# Patient Record
Sex: Male | Born: 1970 | Race: Black or African American | Hispanic: No | Marital: Married | Smoking: Never smoker
Health system: Southern US, Community
[De-identification: ages and names within clinical notes are randomized; demographics above are authoritative.]

## PROBLEM LIST (undated history)

## (undated) DIAGNOSIS — E7800 Pure hypercholesterolemia, unspecified: Secondary | ICD-10-CM

## (undated) DIAGNOSIS — K5900 Constipation, unspecified: Secondary | ICD-10-CM

## (undated) DIAGNOSIS — Z91018 Allergy to other foods: Secondary | ICD-10-CM

## (undated) DIAGNOSIS — G4730 Sleep apnea, unspecified: Secondary | ICD-10-CM

## (undated) DIAGNOSIS — M2550 Pain in unspecified joint: Secondary | ICD-10-CM

## (undated) HISTORY — DX: Sleep apnea, unspecified: G47.30

## (undated) HISTORY — DX: Allergy to other foods: Z91.018

## (undated) HISTORY — DX: Constipation, unspecified: K59.00

## (undated) HISTORY — DX: Pure hypercholesterolemia, unspecified: E78.00

## (undated) HISTORY — PX: OTHER SURGICAL HISTORY: SHX169

## (undated) HISTORY — DX: Pain in unspecified joint: M25.50

---

## 2014-04-25 ENCOUNTER — Emergency Department (HOSPITAL_COMMUNITY): Payer: BC Managed Care – PPO

## 2014-04-25 ENCOUNTER — Encounter (HOSPITAL_COMMUNITY): Payer: Self-pay | Admitting: *Deleted

## 2014-04-25 ENCOUNTER — Emergency Department (HOSPITAL_COMMUNITY)
Admission: EM | Admit: 2014-04-25 | Discharge: 2014-04-25 | Disposition: A | Discharge status: Home / Self Care | Payer: BC Managed Care – PPO | Attending: Emergency Medicine | Admitting: Emergency Medicine

## 2014-04-25 DIAGNOSIS — Y92410 Unspecified street and highway as the place of occurrence of the external cause: Secondary | ICD-10-CM | POA: Diagnosis not present

## 2014-04-25 DIAGNOSIS — R109 Unspecified abdominal pain: Secondary | ICD-10-CM

## 2014-04-25 DIAGNOSIS — Y998 Other external cause status: Secondary | ICD-10-CM | POA: Diagnosis not present

## 2014-04-25 DIAGNOSIS — S3991XA Unspecified injury of abdomen, initial encounter: Secondary | ICD-10-CM | POA: Insufficient documentation

## 2014-04-25 DIAGNOSIS — Y9389 Activity, other specified: Secondary | ICD-10-CM | POA: Diagnosis not present

## 2014-04-25 DIAGNOSIS — S3992XA Unspecified injury of lower back, initial encounter: Secondary | ICD-10-CM | POA: Diagnosis present

## 2014-04-25 DIAGNOSIS — R319 Hematuria, unspecified: Secondary | ICD-10-CM | POA: Insufficient documentation

## 2014-04-25 LAB — CBC WITH DIFFERENTIAL/PLATELET
Basophils Absolute: 0 10*3/uL (ref 0.0–0.1)
Basophils Relative: 0 % (ref 0–1)
EOS PCT: 0 % (ref 0–5)
Eosinophils Absolute: 0 10*3/uL (ref 0.0–0.7)
HCT: 45.5 % (ref 39.0–52.0)
HEMOGLOBIN: 16.8 g/dL (ref 13.0–17.0)
LYMPHS ABS: 1.9 10*3/uL (ref 0.7–4.0)
Lymphocytes Relative: 14 % (ref 12–46)
MCH: 30.6 pg (ref 26.0–34.0)
MCHC: 36.9 g/dL — ABNORMAL HIGH (ref 30.0–36.0)
MCV: 82.9 fL (ref 78.0–100.0)
Monocytes Absolute: 1.3 10*3/uL — ABNORMAL HIGH (ref 0.1–1.0)
Monocytes Relative: 10 % (ref 3–12)
Neutro Abs: 10.2 10*3/uL — ABNORMAL HIGH (ref 1.7–7.7)
Neutrophils Relative %: 76 % (ref 43–77)
Platelets: 261 10*3/uL (ref 150–400)
RBC: 5.49 MIL/uL (ref 4.22–5.81)
RDW: 13.4 % (ref 11.5–15.5)
WBC: 13.5 10*3/uL — ABNORMAL HIGH (ref 4.0–10.5)

## 2014-04-25 LAB — URINE MICROSCOPIC-ADD ON

## 2014-04-25 LAB — I-STAT CHEM 8, ED
BUN: 11 mg/dL (ref 6–23)
CALCIUM ION: 1.19 mmol/L (ref 1.12–1.23)
CHLORIDE: 106 meq/L (ref 96–112)
Creatinine, Ser: 1 mg/dL (ref 0.50–1.35)
GLUCOSE: 112 mg/dL — AB (ref 70–99)
HCT: 51 % (ref 39.0–52.0)
Hemoglobin: 17.3 g/dL — ABNORMAL HIGH (ref 13.0–17.0)
POTASSIUM: 4 meq/L (ref 3.7–5.3)
Sodium: 139 mEq/L (ref 137–147)
TCO2: 22 mmol/L (ref 0–100)

## 2014-04-25 LAB — URINALYSIS, ROUTINE W REFLEX MICROSCOPIC
BILIRUBIN URINE: NEGATIVE
GLUCOSE, UA: NEGATIVE mg/dL
KETONES UR: NEGATIVE mg/dL
Leukocytes, UA: NEGATIVE
Nitrite: NEGATIVE
PH: 5.5 (ref 5.0–8.0)
Protein, ur: 30 mg/dL — AB
SPECIFIC GRAVITY, URINE: 1.024 (ref 1.005–1.030)
Urobilinogen, UA: 0.2 mg/dL (ref 0.0–1.0)

## 2014-04-25 LAB — CK: Total CK: 204 U/L (ref 7–232)

## 2014-04-25 MED ORDER — IOHEXOL 300 MG/ML  SOLN
100.0000 mL | Freq: Once | INTRAMUSCULAR | Status: AC | PRN
  Administered 2014-04-25: 100 mL via INTRAVENOUS

## 2014-04-25 MED ORDER — TETRACAINE HCL 0.5 % OP SOLN: 2.0000 [drp] | Freq: Once | OPHTHALMIC | Status: DC

## 2014-04-25 MED ORDER — CYCLOBENZAPRINE HCL 10 MG PO TABS: 10.0000 mg | ORAL_TABLET | Freq: Three times a day (TID) | ORAL | Status: DC | PRN

## 2014-04-25 MED ORDER — SODIUM CHLORIDE 0.9 % IV BOLUS (SEPSIS)
1000.0000 mL | Freq: Once | INTRAVENOUS | Status: AC
  Administered 2014-04-25: 1000 mL via INTRAVENOUS

## 2014-04-25 MED ORDER — OXYCODONE-ACETAMINOPHEN 5-325 MG PO TABS: 1.0000 | ORAL_TABLET | ORAL | Status: DC | PRN

## 2014-04-25 MED ORDER — OXYCODONE-ACETAMINOPHEN 5-325 MG PO TABS
1.0000 | ORAL_TABLET | Freq: Once | ORAL | Status: AC
  Administered 2014-04-25: 1 via ORAL
  Filled 2014-04-25: qty 1

## 2014-04-25 MED ORDER — FLUORESCEIN SODIUM 1 MG OP STRP: 1.0000 | ORAL_STRIP | Freq: Once | OPHTHALMIC | Status: DC
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
## 2014-04-26 ENCOUNTER — Encounter (HOSPITAL_COMMUNITY): Payer: Self-pay | Admitting: Family Medicine

## 2014-04-26 ENCOUNTER — Emergency Department (HOSPITAL_COMMUNITY)
Admission: EM | Admit: 2014-04-26 | Discharge: 2014-04-26 | Disposition: A | Discharge status: Home / Self Care | Payer: BC Managed Care – PPO | Attending: Emergency Medicine | Admitting: Emergency Medicine

## 2014-04-26 DIAGNOSIS — Z87442 Personal history of urinary calculi: Secondary | ICD-10-CM | POA: Diagnosis not present

## 2014-04-26 DIAGNOSIS — G8911 Acute pain due to trauma: Secondary | ICD-10-CM | POA: Insufficient documentation

## 2014-04-26 DIAGNOSIS — R319 Hematuria, unspecified: Secondary | ICD-10-CM | POA: Diagnosis present

## 2014-04-26 DIAGNOSIS — R109 Unspecified abdominal pain: Secondary | ICD-10-CM | POA: Insufficient documentation

## 2014-04-26 LAB — URINALYSIS, ROUTINE W REFLEX MICROSCOPIC
BILIRUBIN URINE: NEGATIVE
Glucose, UA: NEGATIVE mg/dL
Hgb urine dipstick: NEGATIVE
KETONES UR: NEGATIVE mg/dL
LEUKOCYTES UA: NEGATIVE
Nitrite: NEGATIVE
PROTEIN: NEGATIVE mg/dL
Specific Gravity, Urine: 1.024 (ref 1.005–1.030)
Urobilinogen, UA: 1 mg/dL (ref 0.0–1.0)
pH: 6.5 (ref 5.0–8.0)

## 2014-04-26 MED ORDER — OXYCODONE-ACETAMINOPHEN 5-325 MG PO TABS
2.0000 | ORAL_TABLET | Freq: Once | ORAL | Status: AC
  Administered 2014-04-26: 2 via ORAL
  Filled 2014-04-26: qty 2
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
## 2014-04-26 NOTE — Discharge Instructions (Signed)
Return to the emergency room with worsening of symptoms, new symptoms or with symptoms that are concerning, especially fevers, loss of control of bladder or bowels, numbness or tingling around genital region or anus, weakness. Follow up with urology if you develop blood in your urine.  Continue to take narcotic pain medication for severe pain. Do not operate machinery, drive or drink alcohol while taking narcotics or muscle relaxers.
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
## 2019-08-22 ENCOUNTER — Ambulatory Visit: Payer: BC Managed Care – PPO | Attending: Family

## 2019-08-22 DIAGNOSIS — Z23 Encounter for immunization: Secondary | ICD-10-CM

## 2019-08-22 NOTE — Progress Notes (Signed)
   Covid-[facility] Vaccination [facility]  Name:  Brad Thompson    MRN: [id number] DOB: 24-Jan-1971  08/22/2019  Brad Thompson was observed post Covid-19 immunization for 15 minutes without incident. He was provided with Vaccine Information Sheet and instruction to access the V-Safe system.   Brad Thompson was instructed to call 911 with any severe reactions post vaccine: Marland Kitchen Difficulty breathing  . Swelling of face and throat  . A fast heartbeat  . A bad rash all over body  . Dizziness and weakness   Immunizations Administered    Name Date Dose VIS Date Route   Moderna COVID-19 Vaccine 08/22/2019 11:25 AM 0.5 mL 05/14/2019 Intramuscular   Manufacturer: Moderna   Lot: [id number]   NDC: 76701-100-34

## 2019-09-03 ENCOUNTER — Other Ambulatory Visit: Payer: Self-pay

## 2019-09-03 ENCOUNTER — Encounter (INDEPENDENT_AMBULATORY_CARE_PROVIDER_SITE_OTHER): Payer: Self-pay | Admitting: Family Medicine

## 2019-09-03 ENCOUNTER — Ambulatory Visit (INDEPENDENT_AMBULATORY_CARE_PROVIDER_SITE_OTHER): Payer: BC Managed Care – PPO | Admitting: Family Medicine

## 2019-09-03 VITALS — BP 139/77 | HR 78 | Temp 98.3°F | Ht 71.0 in | Wt 279.0 lb

## 2019-09-03 DIAGNOSIS — E7849 Other hyperlipidemia: Secondary | ICD-10-CM

## 2019-09-03 DIAGNOSIS — R0602 Shortness of breath: Secondary | ICD-10-CM | POA: Diagnosis not present

## 2019-09-03 DIAGNOSIS — G4733 Obstructive sleep apnea (adult) (pediatric): Secondary | ICD-10-CM | POA: Diagnosis not present

## 2019-09-03 DIAGNOSIS — Z1331 Encounter for screening for depression: Secondary | ICD-10-CM | POA: Diagnosis not present

## 2019-09-03 DIAGNOSIS — Z9989 Dependence on other enabling machines and devices: Secondary | ICD-10-CM

## 2019-09-03 DIAGNOSIS — R5383 Other fatigue: Secondary | ICD-10-CM | POA: Diagnosis not present

## 2019-09-03 DIAGNOSIS — Z0289 Encounter for other administrative examinations: Secondary | ICD-10-CM

## 2019-09-03 DIAGNOSIS — Z6838 Body mass index (BMI) 38.0-38.9, adult: Secondary | ICD-10-CM

## 2019-09-03 DIAGNOSIS — Z9189 Other specified personal risk factors, not elsewhere classified: Secondary | ICD-10-CM | POA: Diagnosis not present

## 2019-09-03 NOTE — Progress Notes (Signed)
Chief Complaint:   OBESITY Brad Thompson (MR# [id number]) is a 49 y.o. male who presents for evaluation and treatment of obesity and related comorbidities. Current BMI is Body mass index is 38.91 kg/m. Brad Thompson has been struggling with his weight for many years and has been unsuccessful in either losing weight, maintaining weight loss, or reaching his healthy weight goal.  Brad Thompson is currently in the action stage of change and ready to dedicate time achieving and maintaining a healthier weight. Brad Thompson is interested in becoming our patient and working on intensive lifestyle modifications including (but not limited to) diet and exercise for weight loss.  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  He is married with 2 children, ages 51 and 60.    He provided the following food recall: Breakfast:  Skips. Lunch:  Vending machine or take out. Dinner:  Wife cooks. Drinks: Regular sodas and sweet tea.  Brad Thompson's habits were reviewed today and are as follows: His family eats meals together, he thinks his family will eat healthier with him, his desired weight loss is 58 pounds, he started gaining weight in his mid 30s and 40s, his heaviest weight ever was 283 pounds, he craves chips (Doritos) and [employer] soda, he snacks frequently in the evenings, he skips breakfast frequently, he is frequently drinking liquids with calories and he struggles with emotional eating.  Depression Screen Brad Thompson's Food and Mood (modified PHQ-9) score was 10.  Depression screen Brad Thompson 2/9 09/03/2019  Decreased Interest 2  Down, Depressed, Hopeless 1  PHQ - 2 Score 3  Altered sleeping 1  Tired, decreased energy 3  Change in appetite 1  Feeling bad or failure about yourself  0  Trouble concentrating 1  Moving slowly or fidgety/restless 1  Suicidal thoughts 0  PHQ-9 Score 10  Difficult doing work/chores Not difficult at all   Subjective:   1. Other fatigue Brad Thompson admits to daytime somnolence and denies waking up still  tired. Patent has a history of symptoms of morning fatigue. Brad Thompson generally gets 6 hours of sleep per night, and states that he has generally restful sleep. Snoring is present. Apneic episodes are present. Epworth Sleepiness Score is 9.  2. SOB (shortness of breath) on exertion Brad Thompson notes increasing shortness of breath with exercising and seems to be worsening over time with weight gain. He notes getting out of breath sooner with activity than he used to. This has not gotten worse recently. Brad Thompson denies shortness of breath at rest or orthopnea.  3. OSA on CPAP Brad Thompson has a diagnosis of sleep apnea. He reports that he is using a CPAP regularly.  Epworth score is 9.  Situation Chance of Dozing or Sleeping  Sitting and reading 2 = moderate chance of dozing or sleeping  Watching television 1 = slight chance of dozing or sleeping  Sitting inactive in a public place (theater or meeting) 1 = slight chance of dozing or sleeping  Sitting as a passenger for a car in an hour 1 = slight chance of dozing or sleeping  Lying down in the afternoon when circumstances permit 3 = high chance of dozing or sleeping  Sitting and talking to someone 0 = would never doze or sleep  Sitting quietly after lunch without alcohol 1 = slight chance of dozing or sleeping  In a car, while stopped for a few minutes in traffic 0 = would never doze or sleep  TOTAL    4. Other hyperlipidemia [name] has hyperlipidemia  and has been trying to improve his cholesterol levels with intensive lifestyle modification including a low saturated fat diet, exercise and weight loss. He denies any chest pain, claudication or myalgias.  5. Depression screening Brad Thompson was screened for depression as part of his new patient workup today.  Assessment/Plan:   1. Other fatigue Brad Thompson does feel that his weight is causing his energy to be lower than it should be. Fatigue may be related to obesity, depression or many other causes. Labs will be ordered, and in  the meanwhile, Brad Thompson will focus on self care including making healthy food choices, increasing physical activity and focusing on stress reduction.  Orders - EKG 12-Lead  2. SOB (shortness of breath) on exertion Brad Thompson does feel that he gets out of breath more easily that he used to when he exercises. Brad Thompson's shortness of breath appears to be obesity related and exercise induced. He has agreed to work on weight loss and gradually increase exercise to treat his exercise induced shortness of breath. Will continue to monitor closely.  3. OSA on CPAP Intensive lifestyle modifications are the first line treatment for this issue. We discussed several lifestyle modifications today and he will continue to work on diet, exercise and weight loss efforts. We will continue to monitor. Orders and follow up as documented in patient record.   Counseling  Sleep apnea is a condition in which breathing pauses or becomes shallow during sleep. This happens over and over during the night. This disrupts your sleep and keeps your body from getting the rest that it needs, which can cause tiredness and lack of energy (fatigue) during the day.  Sleep apnea treatment: If you were given a device to open your airway while you sleep, USE IT!  Sleep hygiene:   Limit or avoid alcohol, caffeinated beverages, and cigarettes, especially close to bedtime.   Do not eat a large meal or eat spicy foods right before bedtime. This can lead to digestive discomfort that can make it hard for you to sleep.  Keep a sleep diary to help you and your health care provider figure out what could be causing your insomnia.  . Make your bedroom a dark, comfortable place where it is easy to fall asleep. ? Put up shades or blackout curtains to block light from outside. ? Use a white noise machine to block noise. ? Keep the temperature cool. . Limit screen use before bedtime. This includes: ? Watching TV. ? Using your smartphone, tablet, or  computer. . Stick to a routine that includes going to bed and waking up at the same times every day and night. This can help you fall asleep faster. Consider making a quiet activity, such as reading, part of your nighttime routine. . Try to avoid taking naps during the day so that you sleep better at night. . Get out of bed if you are still awake after 15 minutes of trying to sleep. Keep the lights down, but try reading or doing a quiet activity. When you feel sleepy, go back to bed.  4. Other hyperlipidemia Cardiovascular risk and specific lipid/LDL goals reviewed.  We discussed several lifestyle modifications today and Brad Thompson will continue to work on diet, exercise and weight loss efforts. Orders and follow up as documented in patient record.   Counseling Intensive lifestyle modifications are the first line treatment for this issue. . Dietary changes: Increase soluble fiber. Decrease simple carbohydrates. . Exercise changes: Moderate to vigorous-intensity aerobic activity 150 minutes per week if tolerated. Marland Kitchen  Lipid-lowering medications: see documented in medical record.  5. Depression screening Brad Thompson had a positive depression screening. Depression is commonly associated with obesity and often results in emotional eating behaviors. We will monitor this closely and work on CBT to help improve the non-hunger eating patterns. Referral to Psychology may be required if no improvement is seen as he continues in our clinic.  6. At risk for heart disease Brad Thompson was given approximately 15 minutes of coronary artery disease prevention counseling today. He is 49 y.o. male and has risk factors for heart disease including obesity. We discussed intensive lifestyle modifications today with an emphasis on specific weight loss instructions and strategies.   Repetitive spaced learning was employed today to elicit superior memory formation and behavioral change.  7. Class 2 severe obesity with serious comorbidity and  body mass index (BMI) of 38.0 to 38.9 in adult, unspecified obesity type [facility]) Brad Thompson is currently in the action stage of change and his goal is to continue with weight loss efforts. I recommend Brad Thompson begin the structured treatment plan as follows:  He has agreed to the Category 3 Plan.  Exercise goals: No exercise has been prescribed at this time.   Behavioral modification strategies: increasing lean protein intake, decreasing simple carbohydrates, increasing vegetables, increasing water intake and decreasing liquid calories.  He was informed of the importance of frequent follow-up visits to maximize his success with intensive lifestyle modifications for his multiple health conditions. He was informed we would discuss his lab results at his next visit unless there is a critical issue that needs to be addressed sooner. Brad Thompson agreed to keep his next visit at the agreed upon time to discuss these results.  Objective:   Blood pressure 139/77, pulse 78, temperature 98.3 F (36.8 C), temperature source Oral, height 5\' 11"  (1.803 m), weight 279 lb (126.6 kg), SpO2 96 %. Body mass index is 38.91 kg/m.  EKG: Normal sinus rhythm, rate 83 bpm.  Indirect Calorimeter completed today shows a VO2 of 317 and a REE of 2210.  His calculated basal metabolic rate is 1856 thus his basal metabolic rate is worse than expected.  General: Cooperative, alert, well developed, in no acute distress. HEENT: Conjunctivae and lids unremarkable. Cardiovascular: Regular rhythm.  Lungs: Normal work of breathing. Neurologic: No focal deficits.   Lab Results  Component Value Date   CREATININE 1.00 04/25/2014   BUN 11 04/25/2014   NA 139 04/25/2014   K 4.0 04/25/2014   CL 106 04/25/2014   Lab Results  Component Value Date   WBC 13.5 (H) 04/25/2014   HGB 17.3 (H) 04/25/2014   HCT 51.0 04/25/2014   MCV 82.9 04/25/2014   PLT 261 04/25/2014   Attestation Statements:   This is the patient's first visit at [facility]. The patient's NEW PATIENT PACKET was reviewed at length. Included in the packet: current and past health history, medications, allergies, ROS, gynecologic history (women only), surgical history, family history, social history, weight history, weight loss surgery history (for those that have had weight loss surgery), nutritional evaluation, mood and food questionnaire, PHQ9, Epworth questionnaire, sleep habits questionnaire, patient life and health improvement goals questionnaire. These will all be scanned into the patient's chart under media.   During the visit, I independently reviewed the patient's EKG, bioimpedance scale results, and indirect calorimeter results. I used this information to tailor a meal plan for the patient that will help him to lose weight and will improve his obesity-related conditions going forward. I  performed a medically necessary appropriate examination and/or evaluation. I discussed the assessment and treatment plan with the patient. The patient was provided an opportunity to ask questions and all were answered. The patient agreed with the plan and demonstrated an understanding of the instructions. Labs were ordered at this visit and will be reviewed at the next visit unless more critical results need to be addressed immediately. Clinical information was updated and documented in the EMR.   I, Insurance claims handler, CMA, am acting as Energy manager for [employer], DO.  I have reviewed the above documentation for accuracy and completeness, and I agree with the above. Helane Rima, DO

## 2019-09-04 LAB — COMPREHENSIVE METABOLIC PANEL
ALT: 43 IU/L (ref 0–44)
AST: 29 IU/L (ref 0–40)
Albumin/Globulin Ratio: 1.7 (ref 1.2–2.2)
Albumin: 4.6 g/dL (ref 4.0–5.0)
Alkaline Phosphatase: 59 IU/L (ref 39–117)
BUN/Creatinine Ratio: 13 (ref 9–20)
BUN: 13 mg/dL (ref 6–24)
Bilirubin Total: 0.4 mg/dL (ref 0.0–1.2)
CO2: 22 mmol/L (ref 20–29)
Calcium: 9.5 mg/dL (ref 8.7–10.2)
Chloride: 103 mmol/L (ref 96–106)
Creatinine, Ser: 0.99 mg/dL (ref 0.76–1.27)
GFR calc Af Amer: 105 mL/min/{1.73_m2} (ref >59)
GFR calc non Af Amer: 91 mL/min/{1.73_m2} (ref >59)
Globulin, Total: 2.7 g/dL (ref 1.5–4.5)
Glucose: 98 mg/dL (ref 65–99)
Potassium: 4.5 mmol/L (ref 3.5–5.2)
Sodium: 138 mmol/L (ref 134–144)
Total Protein: 7.3 g/dL (ref 6.0–8.5)

## 2019-09-04 LAB — CBC WITH DIFFERENTIAL/PLATELET
Basophils Absolute: 0.1 10*3/uL (ref 0.0–0.2)
Basos: 1 %
EOS (ABSOLUTE): 0.4 10*3/uL (ref 0.0–0.4)
Eos: 4 %
Hematocrit: 35.6 % — ABNORMAL LOW (ref 37.5–51.0)
Hemoglobin: 11.5 g/dL — ABNORMAL LOW (ref 13.0–17.7)
Immature Grans (Abs): 0 10*3/uL (ref 0.0–0.1)
Immature Granulocytes: 0 %
Lymphocytes Absolute: 2.6 10*3/uL (ref 0.7–3.1)
Lymphs: 29 %
MCH: 26.3 pg — ABNORMAL LOW (ref 26.6–33.0)
MCHC: 32.3 g/dL (ref 31.5–35.7)
MCV: 81 fL (ref 79–97)
Monocytes Absolute: 0.5 10*3/uL (ref 0.1–0.9)
Monocytes: 6 %
Neutrophils Absolute: 5.6 10*3/uL (ref 1.4–7.0)
Neutrophils: 60 %
Platelets: 399 10*3/uL (ref 150–450)
RBC: 4.38 x10E6/uL (ref 4.14–5.80)
RDW: 14.6 % (ref 11.6–15.4)
WBC: 9.2 10*3/uL (ref 3.4–10.8)

## 2019-09-04 LAB — LIPID PANEL
Chol/HDL Ratio: 4.5 ratio (ref 0.0–5.0)
Cholesterol, Total: 202 mg/dL — ABNORMAL HIGH (ref 100–199)
HDL: 45 mg/dL (ref >39)
LDL Chol Calc (NIH): 144 mg/dL — ABNORMAL HIGH (ref 0–99)
Triglycerides: 70 mg/dL (ref 0–149)
VLDL Cholesterol Cal: 13 mg/dL (ref 5–40)

## 2019-09-04 LAB — HEMOGLOBIN A1C
Est. average glucose Bld gHb Est-mCnc: 105 mg/dL
Hgb A1c MFr Bld: 5.3 % (ref 4.8–5.6)

## 2019-09-04 LAB — VITAMIN D 25 HYDROXY (VIT D DEFICIENCY, FRACTURES): Vit D, 25-Hydroxy: 23.2 ng/mL — ABNORMAL LOW (ref 30.0–100.0)

## 2019-09-04 LAB — T4, FREE: Free T4: 1.21 ng/dL (ref 0.82–1.77)

## 2019-09-04 LAB — INSULIN, RANDOM: INSULIN: 28.9 u[IU]/mL — ABNORMAL HIGH (ref 2.6–24.9)

## 2019-09-04 LAB — VITAMIN B12: Vitamin B-12: 212 pg/mL — ABNORMAL LOW (ref 232–1245)

## 2019-09-04 LAB — TSH: TSH: 2.2 u[IU]/mL (ref 0.450–4.500)

## 2019-09-04 LAB — T3: T3, Total: 125 ng/dL (ref 71–180)

## 2019-09-17 ENCOUNTER — Ambulatory Visit (INDEPENDENT_AMBULATORY_CARE_PROVIDER_SITE_OTHER): Payer: BC Managed Care – PPO | Admitting: Family Medicine

## 2019-09-17 ENCOUNTER — Other Ambulatory Visit: Payer: Self-pay

## 2019-09-17 ENCOUNTER — Encounter (INDEPENDENT_AMBULATORY_CARE_PROVIDER_SITE_OTHER): Payer: Self-pay | Admitting: Family Medicine

## 2019-09-17 VITALS — BP 145/76 | HR 72 | Temp 98.2°F | Ht 71.0 in | Wt 268.0 lb

## 2019-09-17 DIAGNOSIS — G4733 Obstructive sleep apnea (adult) (pediatric): Secondary | ICD-10-CM

## 2019-09-17 DIAGNOSIS — Z6837 Body mass index (BMI) 37.0-37.9, adult: Secondary | ICD-10-CM

## 2019-09-17 DIAGNOSIS — E538 Deficiency of other specified B group vitamins: Secondary | ICD-10-CM

## 2019-09-17 DIAGNOSIS — E559 Vitamin D deficiency, unspecified: Secondary | ICD-10-CM | POA: Diagnosis not present

## 2019-09-17 DIAGNOSIS — Z9189 Other specified personal risk factors, not elsewhere classified: Secondary | ICD-10-CM | POA: Diagnosis not present

## 2019-09-17 DIAGNOSIS — E7849 Other hyperlipidemia: Secondary | ICD-10-CM | POA: Diagnosis not present

## 2019-09-17 DIAGNOSIS — Z9989 Dependence on other enabling machines and devices: Secondary | ICD-10-CM

## 2019-09-17 DIAGNOSIS — E8881 Metabolic syndrome: Secondary | ICD-10-CM | POA: Diagnosis not present

## 2019-09-17 MED ORDER — VITAMIN D (ERGOCALCIFEROL) 1.25 MG (50000 UNIT) PO CAPS: 50000.0000 [IU] | ORAL_CAPSULE | ORAL | 0 refills | Status: DC

## 2019-09-18 NOTE — Progress Notes (Signed)
Chief Complaint:   OBESITY Brad Thompson is here to discuss his progress with his obesity treatment plan along with follow-up of his obesity related diagnoses. Brad Thompson is on the Category 3 Plan and states he is following his eating plan approximately 100% of the time. Brad Thompson states he is exercising for 0 minutes 0 times per week.  Today's visit was #: 2 Starting weight: 279 lbs Starting date: 09/03/2019 Today's weight: 268 lbs Today's date: 09/17/2019 Total lbs lost to date: 11 lbs Total lbs lost since last in-office visit: 11 lbs  Interim History: [name] says that he likes the structure of the program.  He feels more energetic.  He was able to stop all sodas and sugary drinks.  Subjective:   1. Insulin resistance Brad Thompson has a diagnosis of insulin resistance based on his elevated fasting insulin level >5. He continues to work on diet and exercise to decrease his risk of diabetes.  Lab Results  Component Value Date   INSULIN 28.9 (H) 09/03/2019   Lab Results  Component Value Date   HGBA1C 5.3 09/03/2019   2. Other hyperlipidemia Brad Thompson has hyperlipidemia and has been trying to improve his cholesterol levels with intensive lifestyle modification including a low saturated fat diet, exercise and weight loss. He denies any chest pain, claudication or myalgias.  Lab Results  Component Value Date   ALT 43 09/03/2019   AST 29 09/03/2019   ALKPHOS 59 09/03/2019   BILITOT 0.4 09/03/2019   Lab Results  Component Value Date   CHOL 202 (H) 09/03/2019   HDL 45 09/03/2019   LDLCALC 144 (H) 09/03/2019   TRIG 70 09/03/2019   CHOLHDL 4.5 09/03/2019   3. Vitamin D deficiency Brad Thompson's Vitamin D level was 23.2 on 09/03/2019. He is currently taking no vitamin D supplement. He denies nausea, vomiting or muscle weakness.  4. Vitamin B12 deficiency He is not a vegetarian.  He does not have a previous diagnosis of pernicious anemia.  He does not have a history of weight loss surgery.   Lab Results    Component Value Date   VITAMINB12 212 (L) 09/03/2019   5. OSA on CPAP Brad Thompson has a diagnosis of sleep apnea. He reports that he is using a CPAP regularly.   Assessment/Plan:   1. Insulin resistance Brad Thompson will continue to work on weight loss, exercise, and decreasing simple carbohydrates to help decrease the risk of diabetes. Brad Thompson agreed to follow-up with [place] as directed to closely monitor his progress.  We reviewed metformin as a potential treatment.  2. Other hyperlipidemia Cardiovascular risk and specific lipid/LDL goals reviewed.  We discussed several lifestyle modifications today and Brad Thompson will continue to work on diet, exercise and weight loss efforts. Orders and follow up as documented in patient record.   Counseling Intensive lifestyle modifications are the first line treatment for this issue. . Dietary changes: Increase soluble fiber. Decrease simple carbohydrates. . Exercise changes: Moderate to vigorous-intensity aerobic activity 150 minutes per week if tolerated. . Lipid-lowering medications: see documented in medical record.  3. Vitamin D deficiency Low Vitamin D level contributes to fatigue and are associated with obesity, breast, and colon cancer. He agrees to start to take prescription Vitamin D @50 ,000 IU every week and will follow-up for routine testing of Vitamin D, at least 2-3 times per year to avoid over-replacement.  Orders - Vitamin D, Ergocalciferol, (DRISDOL) 1.25 MG (50000 UNIT) CAPS capsule; Take 1 capsule (50,000 Units total) by mouth every 7 (seven) days.  Dispense:  4 capsule; Refill: 0  4. Vitamin B12 deficiency The diagnosis was reviewed with the patient. Counseling provided today, see below. We will continue to monitor. Orders and follow up as documented in patient record.  Brad Thompson will start a men's multivitamin that he will pick up OTC.  Counseling . The body needs vitamin B12: to make red blood cells; to make DNA; and to help the nerves work properly so they  can carry messages from the brain to the body.  . The main causes of vitamin B12 deficiency include dietary deficiency, digestive diseases, pernicious anemia, and having a surgery in which part of the stomach or small intestine is removed.  . Certain medicines can make it harder for the body to absorb vitamin B12. These medicines include: heartburn medications; some antibiotics; some medications used to treat diabetes, gout, and high cholesterol.  . In some cases, there are no symptoms of this condition. If the condition leads to anemia or nerve damage, various symptoms can occur, such as weakness or fatigue, shortness of breath, and numbness or tingling in your hands and feet.   . Treatment:  o May include taking vitamin B12 supplements.  o Avoid alcohol.  o Eat lots of healthy foods that contain vitamin B12: - Beef, pork, chicken, [place], and organ meats, such as liver.  - Seafood: This includes clams, rainbow trout, salmon, tuna, and haddock. Eggs.  - Cereal and dairy products that are fortified: This means that vitamin B12 has been added to the food.   5. OSA on CPAP Intensive lifestyle modifications are the first line treatment for this issue. We discussed several lifestyle modifications today and he will continue to work on diet, exercise and weight loss efforts. We will continue to monitor. Orders and follow up as documented in patient record.   Counseling  Sleep apnea is a condition in which breathing pauses or becomes shallow during sleep. This happens over and over during the night. This disrupts your sleep and keeps your body from getting the rest that it needs, which can cause tiredness and lack of energy (fatigue) during the day.  Sleep apnea treatment: If you were given a device to open your airway while you sleep, USE IT!  Sleep hygiene:   Limit or avoid alcohol, caffeinated beverages, and cigarettes, especially close to bedtime.   Do not eat a large meal or eat spicy foods  right before bedtime. This can lead to digestive discomfort that can make it hard for you to sleep.  Keep a sleep diary to help you and your health care provider figure out what could be causing your insomnia.  . Make your bedroom a dark, comfortable place where it is easy to fall asleep. ? Put up shades or blackout curtains to block light from outside. ? Use a white noise machine to block noise. ? Keep the temperature cool. . Limit screen use before bedtime. This includes: ? Watching TV. ? Using your smartphone, tablet, or computer. . Stick to a routine that includes going to bed and waking up at the same times every day and night. This can help you fall asleep faster. Consider making a quiet activity, such as reading, part of your nighttime routine. . Try to avoid taking naps during the day so that you sleep better at night. . Get out of bed if you are still awake after 15 minutes of trying to sleep. Keep the lights down, but try reading or doing a quiet activity. When you feel sleepy,  go back to bed.  6. At risk for diabetes mellitus Brad Thompson was given approximately 15 minutes of diabetes education and counseling today. We discussed intensive lifestyle modifications today with an emphasis on weight loss as well as increasing exercise and decreasing simple carbohydrates in his diet. We also reviewed medication options with an emphasis on risk versus benefit of those discussed.   Repetitive spaced learning was employed today to elicit superior memory formation and behavioral change.  7. Class 2 severe obesity with serious comorbidity and body mass index (BMI) of 37.0 to 37.9 in adult, unspecified obesity type [facility]) Brad Thompson is currently in the action stage of change. As such, his goal is to continue with weight loss efforts. He has agreed to the Category 3 Plan.   Exercise goals: Walking for 30 minutes 4 times a week.  Behavioral modification strategies: increasing lean protein intake and increasing  water intake.  Brad Thompson has agreed to follow-up with our clinic in 2 weeks. He was informed of the importance of frequent follow-up visits to maximize his success with intensive lifestyle modifications for his multiple health conditions.   Objective:   Blood pressure (!) 145/76, pulse 72, temperature 98.2 F (36.8 C), temperature source Oral, height 5\' 11"  (1.803 m), weight 268 lb (121.6 kg), SpO2 95 %. Body mass index is 37.38 kg/m.  General: Cooperative, alert, well developed, in no acute distress. HEENT: Conjunctivae and lids unremarkable. Cardiovascular: Regular rhythm.  Lungs: Normal work of breathing. Neurologic: No focal deficits.   Lab Results  Component Value Date   CREATININE 0.99 09/03/2019   BUN 13 09/03/2019   NA 138 09/03/2019   K 4.5 09/03/2019   CL 103 09/03/2019   CO2 22 09/03/2019   Lab Results  Component Value Date   ALT 43 09/03/2019   AST 29 09/03/2019   ALKPHOS 59 09/03/2019   BILITOT 0.4 09/03/2019   Lab Results  Component Value Date   HGBA1C 5.3 09/03/2019   Lab Results  Component Value Date   INSULIN 28.9 (H) 09/03/2019   Lab Results  Component Value Date   TSH 2.200 09/03/2019   Lab Results  Component Value Date   CHOL 202 (H) 09/03/2019   HDL 45 09/03/2019   LDLCALC 144 (H) 09/03/2019   TRIG 70 09/03/2019   CHOLHDL 4.5 09/03/2019   Lab Results  Component Value Date   WBC 9.2 09/03/2019   HGB 11.5 (L) 09/03/2019   HCT 35.6 (L) 09/03/2019   MCV 81 09/03/2019   PLT 399 09/03/2019   Attestation Statements:   Reviewed by clinician on day of visit: allergies, medications, problem list, medical history, surgical history, family history, social history, and previous encounter notes.  I, 09/05/2019, CMA, am acting as Insurance claims handler for Energy manager, DO.  I have reviewed the above documentation for accuracy and completeness, and I agree with the above. [employer], DO

## 2019-09-24 ENCOUNTER — Ambulatory Visit: Payer: BC Managed Care – PPO | Attending: Family

## 2019-09-24 DIAGNOSIS — Z23 Encounter for immunization: Secondary | ICD-10-CM

## 2019-09-24 NOTE — Progress Notes (Signed)
   Covid-19 Vaccination Clinic  Name:  Brad Thompson    MRN: [id number] DOB: 1970-09-06  09/24/2019  [name] was observed post Covid-19 immunization for 15 minutes without incident. He was provided with Vaccine Information Sheet and instruction to access the V-Safe system.   [name] was instructed to call 911 with any severe reactions post vaccine: Marland Kitchen Difficulty breathing  . Swelling of face and throat  . A fast heartbeat  . A bad rash all over body  . Dizziness and weakness   Immunizations Administered    Name Date Dose VIS Date Route   Moderna COVID-19 Vaccine 09/24/2019 11:19 AM 0.5 mL 05/14/2019 Intramuscular   Manufacturer: Moderna   Lot: [id number]   NDC: 72094-709-62

## 2019-10-14 ENCOUNTER — Encounter (INDEPENDENT_AMBULATORY_CARE_PROVIDER_SITE_OTHER): Payer: Self-pay | Admitting: Family Medicine

## 2019-10-14 ENCOUNTER — Ambulatory Visit (INDEPENDENT_AMBULATORY_CARE_PROVIDER_SITE_OTHER): Payer: BC Managed Care – PPO | Admitting: Family Medicine

## 2019-10-14 ENCOUNTER — Other Ambulatory Visit: Payer: Self-pay

## 2019-10-14 VITALS — BP 141/79 | HR 77 | Temp 98.8°F | Ht 71.0 in | Wt 263.0 lb

## 2019-10-14 DIAGNOSIS — Z6836 Body mass index (BMI) 36.0-36.9, adult: Secondary | ICD-10-CM

## 2019-10-14 DIAGNOSIS — Z9989 Dependence on other enabling machines and devices: Secondary | ICD-10-CM

## 2019-10-14 DIAGNOSIS — E559 Vitamin D deficiency, unspecified: Secondary | ICD-10-CM | POA: Diagnosis not present

## 2019-10-14 DIAGNOSIS — E8881 Metabolic syndrome: Secondary | ICD-10-CM | POA: Diagnosis not present

## 2019-10-14 DIAGNOSIS — Z9189 Other specified personal risk factors, not elsewhere classified: Secondary | ICD-10-CM | POA: Diagnosis not present

## 2019-10-14 DIAGNOSIS — G4733 Obstructive sleep apnea (adult) (pediatric): Secondary | ICD-10-CM | POA: Diagnosis not present

## 2019-10-14 MED ORDER — VITAMIN D (ERGOCALCIFEROL) 1.25 MG (50000 UNIT) PO CAPS: 50000.0000 [IU] | ORAL_CAPSULE | ORAL | 0 refills | Status: DC

## 2019-10-14 NOTE — Progress Notes (Signed)
Chief Complaint:   OBESITY Brad Thompson is here to discuss his progress with his obesity treatment plan along with follow-up of his obesity related diagnoses. Brad Thompson is on the Category 3 Plan and states he is following his eating plan approximately 90-95% of the time. Brad Thompson states he is doing yard work.  Today's visit was #: 3 Starting weight: 297 lbs Starting date: 09/03/2019 Today's weight: 263 lbs Today's date: 10/14/2019 Total lbs lost to date: 6 lbs Total lbs lost since last in-office visit: 5 lbs  Interim History: Brad Thompson did not get around to walking on the track.  For snacks, he reports that popcorn is is favorite.  He says he is drinking at least 1 liter of water per day.  Subjective:   1. Vitamin D deficiency Brad Thompson's Vitamin D level was 23.2 on 09/03/2019. He is currently taking prescription vitamin D 50,000 IU each week. He denies nausea, vomiting or muscle weakness.  2. Insulin resistance Brad Thompson has a diagnosis of insulin resistance based on his elevated fasting insulin level >5. He continues to work on diet and exercise to decrease his risk of diabetes.  Lab Results  Component Value Date   INSULIN 28.9 (H) 09/03/2019   Lab Results  Component Value Date   HGBA1C 5.3 09/03/2019   3. OSA on CPAP Brad Thompson has a diagnosis of sleep apnea. He reports that he is using a CPAP regularly.   Assessment/Plan:   1. Vitamin D deficiency Low Vitamin D level contributes to fatigue and are associated with obesity, breast, and colon cancer. He agrees to continue to take prescription Vitamin D @50 ,000 IU every week and will follow-up for routine testing of Vitamin D, at least 2-3 times per year to avoid over-replacement.  Orders - Vitamin D, Ergocalciferol, (DRISDOL) 1.25 MG (50000 UNIT) CAPS capsule; Take 1 capsule (50,000 Units total) by mouth every 7 (seven) days.  Dispense: 4 capsule; Refill: 0  2. Insulin resistance Brad Thompson will continue to work on weight loss, exercise, and decreasing simple  carbohydrates to help decrease the risk of diabetes. Brad Thompson agreed to follow-up with Brad Thompson as directed to closely monitor his progress.  3. OSA on CPAP Intensive lifestyle modifications are the first line treatment for this issue. We discussed several lifestyle modifications today and he will continue to work on diet, exercise and weight loss efforts. We will continue to monitor. Orders and follow up as documented in patient record.   Counseling  Sleep apnea is a condition in which breathing pauses or becomes shallow during sleep. This happens over and over during the night. This disrupts your sleep and keeps your body from getting the rest that it needs, which can cause tiredness and lack of energy (fatigue) during the day.  Sleep apnea treatment: If you were given a device to open your airway while you sleep, USE IT!  Sleep hygiene:   Limit or avoid alcohol, caffeinated beverages, and cigarettes, especially close to bedtime.   Do not eat a large meal or eat spicy foods right before bedtime. This can lead to digestive discomfort that can make it hard for you to sleep.  Keep a sleep diary to help you and your health care provider figure out what could be causing your insomnia.  . Make your bedroom a dark, comfortable place where it is easy to fall asleep. ? Put up shades or blackout curtains to block light from outside. ? Use a white noise machine to block noise. ? Keep the temperature cool. Brad Thompson  Limit screen use before bedtime. This includes: ? Watching TV. ? Using your smartphone, tablet, or computer. . Stick to a routine that includes going to bed and waking up at the same times every day and night. This can help you fall asleep faster. Consider making a quiet activity, such as reading, part of your nighttime routine. . Try to avoid taking naps during the day so that you sleep better at night. . Get out of bed if you are still awake after 15 minutes of trying to sleep. Keep the lights down, but  try reading or doing a quiet activity. When you feel sleepy, go back to bed.  4. At risk for heart disease Brad Thompson was given approximately 15 minutes of coronary artery disease prevention counseling today. He is 49 y.o. male and has risk factors for heart disease including obesity. We discussed intensive lifestyle modifications today with an emphasis on specific weight loss instructions and strategies.   Repetitive spaced learning was employed today to elicit superior memory formation and behavioral change.  5. Class 2 severe obesity with serious comorbidity and body mass index (BMI) of 36.0 to 36.9 in adult, unspecified obesity type Brad Thompson) Brad Thompson is currently in the action stage of change. As such, his goal is to continue with weight loss efforts. He has agreed to the Category 3 Plan.   Exercise goals: For substantial health benefits, adults should do at least 150 minutes (2 hours and 30 minutes) a week of moderate-intensity, or 75 minutes (1 hour and 15 minutes) a week of vigorous-intensity aerobic physical activity, or an equivalent combination of moderate- and vigorous-intensity aerobic activity. Aerobic activity should be performed in episodes of at least 10 minutes, and preferably, it should be spread throughout the week.  Behavioral modification strategies: increasing water intake.  Brad Thompson has agreed to follow-up with our clinic in 2 weeks. He was informed of the importance of frequent follow-up visits to maximize his success with intensive lifestyle modifications for his multiple health conditions.   Objective:   Blood pressure (!) 141/79, pulse 77, temperature 98.8 F (37.1 C), temperature source Oral, height 5\' 11"  (1.803 m), weight 263 lb (119.3 kg), SpO2 96 %. Body mass index is 36.68 kg/m.  General: Cooperative, alert, well developed, in no acute distress. HEENT: Conjunctivae and lids unremarkable. Cardiovascular: Regular rhythm.  Lungs: Normal work of breathing. Neurologic: No focal  deficits.   Lab Results  Component Value Date   CREATININE 0.99 09/03/2019   BUN 13 09/03/2019   NA 138 09/03/2019   K 4.5 09/03/2019   CL 103 09/03/2019   CO2 22 09/03/2019   Lab Results  Component Value Date   ALT 43 09/03/2019   AST 29 09/03/2019   ALKPHOS 59 09/03/2019   BILITOT 0.4 09/03/2019   Lab Results  Component Value Date   HGBA1C 5.3 09/03/2019   Lab Results  Component Value Date   INSULIN 28.9 (H) 09/03/2019   Lab Results  Component Value Date   TSH 2.200 09/03/2019   Lab Results  Component Value Date   CHOL 202 (H) 09/03/2019   HDL 45 09/03/2019   LDLCALC 144 (H) 09/03/2019   TRIG 70 09/03/2019   CHOLHDL 4.5 09/03/2019   Lab Results  Component Value Date   WBC 9.2 09/03/2019   HGB 11.5 (L) 09/03/2019   HCT 35.6 (L) 09/03/2019   MCV 81 09/03/2019   PLT 399 09/03/2019   Attestation Statements:   Reviewed by clinician on day of visit: allergies, medications,  problem list, medical history, surgical history, family history, social history, and previous encounter notes.  I, Water quality scientist, CMA, am acting as Location manager for [employer], DO.  I have reviewed the above documentation for accuracy and completeness, and I agree with the above. Briscoe Deutscher, DO

## 2019-11-04 ENCOUNTER — Ambulatory Visit (INDEPENDENT_AMBULATORY_CARE_PROVIDER_SITE_OTHER): Payer: BC Managed Care – PPO | Admitting: Family Medicine

## 2019-11-04 ENCOUNTER — Encounter (INDEPENDENT_AMBULATORY_CARE_PROVIDER_SITE_OTHER): Payer: Self-pay | Admitting: Family Medicine

## 2019-11-04 ENCOUNTER — Other Ambulatory Visit: Payer: Self-pay

## 2019-11-04 VITALS — BP 119/73 | HR 77 | Temp 98.7°F | Ht 71.0 in | Wt 262.0 lb

## 2019-11-04 DIAGNOSIS — E559 Vitamin D deficiency, unspecified: Secondary | ICD-10-CM

## 2019-11-04 DIAGNOSIS — Z9189 Other specified personal risk factors, not elsewhere classified: Secondary | ICD-10-CM | POA: Diagnosis not present

## 2019-11-04 DIAGNOSIS — F3289 Other specified depressive episodes: Secondary | ICD-10-CM

## 2019-11-04 DIAGNOSIS — Z6836 Body mass index (BMI) 36.0-36.9, adult: Secondary | ICD-10-CM

## 2019-11-04 MED ORDER — BUPROPION HCL ER (SR) 150 MG PO TB12: 150.0000 mg | ORAL_TABLET | Freq: Every day | ORAL | 0 refills | Status: DC

## 2019-11-04 MED ORDER — VITAMIN D (ERGOCALCIFEROL) 1.25 MG (50000 UNIT) PO CAPS
50000.0000 [IU] | ORAL_CAPSULE | ORAL | 0 refills | Status: DC
Start: 2019-11-04 — End: 2020-01-15

## 2019-11-04 NOTE — Progress Notes (Signed)
Chief Complaint:   OBESITY Brad Thompson is here to discuss his progress with his obesity treatment plan along with follow-up of his obesity related diagnoses. Brad Thompson is on the Category 3 Plan and states he is following his eating plan approximately 90% of the time. Brad Thompson states he is walking 2 miles in 30-40 minutes 2 times per week.  Today's visit was #: 4 Starting weight: 297 lbs Starting date: 09/03/2019 Today's weight: 262 lbs Today's date: 11/04/2019 Total lbs lost to date: 35 lbs Total lbs lost since last in-office visit: 1 lb  Interim History: Brad Thompson reports that he is snacking too much.  He is having cravings, but is not hungry.  He has had an increase in stress lately.  His son is playing lacrosse, which is an increase in expenses.  He says he plans to increase his exercise level for stress reduction.  Subjective:   1. Vitamin D deficiency Brad Thompson's Vitamin D level was 23.2 on 09/03/2019. He is currently taking prescription vitamin D 50,000 IU each week. He denies nausea, vomiting or muscle weakness.  2. At risk for constipation Brad Thompson is at increased risk for constipation due to inadequate water intake, changes in diet, and/or use of medications such as GLP1 agonists. Brad Thompson denies hard, infrequent stools currently.   3. Other depression, with emotional eating Brad Thompson is struggling with emotional eating and using food for comfort to the extent that it is negatively impacting his health. He has been working on behavior modification techniques to help reduce his emotional eating and has been successful. He shows no sign of suicidal or homicidal ideations.   Assessment/Plan:   1. Vitamin D deficiency Low Vitamin D level contributes to fatigue and are associated with obesity, breast, and colon cancer. He agrees to continue to take prescription Vitamin D @50 ,000 IU every week and will follow-up for routine testing of Vitamin D, at least 2-3 times per year to avoid over-replacement.  Orders -  Vitamin D, Ergocalciferol, (DRISDOL) 1.25 MG (50000 UNIT) CAPS capsule; Take 1 capsule (50,000 Units total) by mouth every 7 (seven) days.  Dispense: 4 capsule; Refill: 0  2. At risk for constipation Brad Thompson was given approximately 15 minutes of counseling today regarding prevention of constipation. He was encouraged to increase water and fiber intake.   3. Other depression, with emotional eating Behavior modification techniques were discussed today to help Brad Thompson deal with his emotional/non-hunger eating behaviors.  Orders and follow up as documented in patient record.  We discussed therapy as an option.  He would like to wait.  4. Class 2 severe obesity with serious comorbidity and body mass index (BMI) of 36.0 to 36.9 in adult, unspecified obesity type (Brad Thompson)  Orders - buPROPion (WELLBUTRIN SR) 150 MG 12 hr tablet; Take 1 tablet (150 mg total) by mouth daily.  Dispense: 30 tablet; Refill: 0  Brad Thompson is currently in the action stage of change. As such, his goal is to continue with weight loss efforts. He has agreed to the Category 3 Plan.   Exercise goals: For substantial health benefits, adults should do at least 150 minutes (2 hours and 30 minutes) a week of moderate-intensity, or 75 minutes (1 hour and 15 minutes) a week of vigorous-intensity aerobic physical activity, or an equivalent combination of moderate- and vigorous-intensity aerobic activity. Aerobic activity should be performed in episodes of at least 10 minutes, and preferably, it should be spread throughout the week.  Behavioral modification strategies: increasing lean protein intake and emotional eating strategies.  Brad Thompson has agreed to follow-up with our clinic in 2 weeks. He was informed of the importance of frequent follow-up visits to maximize his success with intensive lifestyle modifications for his multiple health conditions.   Objective:   Blood pressure 119/73, pulse 77, temperature 98.7 F (37.1 C), temperature source Oral,  height 5\' 11"  (1.803 m), weight 262 lb (118.8 kg), SpO2 97 %. Body mass index is 36.54 kg/m.  General: Cooperative, alert, well developed, in no acute distress. HEENT: Conjunctivae and lids unremarkable. Cardiovascular: Regular rhythm.  Lungs: Normal work of breathing. Neurologic: No focal deficits.   Lab Results  Component Value Date   CREATININE 0.99 09/03/2019   BUN 13 09/03/2019   NA 138 09/03/2019   K 4.5 09/03/2019   CL 103 09/03/2019   CO2 22 09/03/2019   Lab Results  Component Value Date   ALT 43 09/03/2019   AST 29 09/03/2019   ALKPHOS 59 09/03/2019   BILITOT 0.4 09/03/2019   Lab Results  Component Value Date   HGBA1C 5.3 09/03/2019   Lab Results  Component Value Date   INSULIN 28.9 (H) 09/03/2019   Lab Results  Component Value Date   TSH 2.200 09/03/2019   Lab Results  Component Value Date   CHOL 202 (H) 09/03/2019   HDL 45 09/03/2019   LDLCALC 144 (H) 09/03/2019   TRIG 70 09/03/2019   CHOLHDL 4.5 09/03/2019   Lab Results  Component Value Date   WBC 9.2 09/03/2019   HGB 11.5 (L) 09/03/2019   HCT 35.6 (L) 09/03/2019   MCV 81 09/03/2019   PLT 399 09/03/2019   Attestation Statements:   Reviewed by clinician on day of visit: allergies, medications, problem list, medical history, surgical history, family history, social history, and previous encounter notes.  I, 09/05/2019, CMA, am acting as Insurance claims handler for Energy manager, DO.  I have reviewed the above documentation for accuracy and completeness, and I agree with the above. [employer], DO

## 2019-11-14 ENCOUNTER — Encounter (INDEPENDENT_AMBULATORY_CARE_PROVIDER_SITE_OTHER): Payer: Self-pay | Admitting: Family Medicine

## 2019-11-14 DIAGNOSIS — F3289 Other specified depressive episodes: Secondary | ICD-10-CM

## 2019-11-14 NOTE — Telephone Encounter (Signed)
Please advise 

## 2019-11-27 ENCOUNTER — Other Ambulatory Visit: Payer: Self-pay

## 2019-11-27 ENCOUNTER — Encounter (INDEPENDENT_AMBULATORY_CARE_PROVIDER_SITE_OTHER): Payer: Self-pay | Admitting: Family Medicine

## 2019-11-27 ENCOUNTER — Ambulatory Visit (INDEPENDENT_AMBULATORY_CARE_PROVIDER_SITE_OTHER): Payer: BC Managed Care – PPO | Admitting: Family Medicine

## 2019-11-27 VITALS — BP 117/61 | HR 72 | Temp 98.1°F | Ht 71.0 in | Wt 261.0 lb

## 2019-11-27 DIAGNOSIS — E559 Vitamin D deficiency, unspecified: Secondary | ICD-10-CM

## 2019-11-27 DIAGNOSIS — Z9189 Other specified personal risk factors, not elsewhere classified: Secondary | ICD-10-CM

## 2019-11-27 DIAGNOSIS — E7800 Pure hypercholesterolemia, unspecified: Secondary | ICD-10-CM

## 2019-11-27 DIAGNOSIS — F3289 Other specified depressive episodes: Secondary | ICD-10-CM

## 2019-11-27 DIAGNOSIS — E8881 Metabolic syndrome: Secondary | ICD-10-CM | POA: Diagnosis not present

## 2019-11-27 DIAGNOSIS — E88819 Insulin resistance, unspecified: Secondary | ICD-10-CM

## 2019-11-27 DIAGNOSIS — Z6836 Body mass index (BMI) 36.0-36.9, adult: Secondary | ICD-10-CM

## 2019-11-27 NOTE — Progress Notes (Signed)
Chief Complaint:   OBESITY Brad Thompson is here to discuss his progress with his obesity treatment plan along with follow-up of his obesity related diagnoses. Brad Thompson is on the Category 3 Plan and states he is following his eating plan approximately 90% of the time. [name] states he is walking for 1-2 miles 3-4 times per week.  Today's visit was #: 5 Starting weight: 297 lbs Starting date: 09/03/2019 Today's weight: 261 lbs Today's date: 11/27/2019 Total lbs lost to date: 36 lbs Total lbs lost since last in-office visit: 1 lbs  Interim History: Brad Thompson is doing well.  He says he has not heard from [facility] yet.  Subjective:   1. Insulin resistance Brad Thompson has a diagnosis of insulin resistance based on his elevated fasting insulin level >5. He continues to work on diet and exercise to decrease his risk of diabetes.  Lab Results  Component Value Date   INSULIN 28.9 (H) 09/03/2019   Lab Results  Component Value Date   HGBA1C 5.3 09/03/2019   2. Pure hypercholesterolemia Brad Thompson has hyperlipidemia and has been trying to improve his cholesterol levels with intensive lifestyle modification including a low saturated fat diet, exercise and weight loss. He denies any chest pain, claudication or myalgias.  Lab Results  Component Value Date   ALT 43 09/03/2019   AST 29 09/03/2019   ALKPHOS 59 09/03/2019   BILITOT 0.4 09/03/2019   Lab Results  Component Value Date   CHOL 202 (H) 09/03/2019   HDL 45 09/03/2019   LDLCALC 144 (H) 09/03/2019   TRIG 70 09/03/2019   CHOLHDL 4.5 09/03/2019   3. Vitamin D deficiency Brad Thompson's Vitamin D level was 23.2 on 323/2021. He is currently taking prescription vitamin D 50,000 IU each week. He denies nausea, vomiting or muscle weakness.  4. Other depression, with emotional eating  Brad Thompson is struggling with emotional eating and using food for comfort to the extent that it is negatively impacting his health. He has been working on behavior modification techniques to help  reduce his emotional eating and has been successful. He shows no sign of suicidal or homicidal ideations.  He did not start the Wellbutrin.  5. At risk for heart disease [name] is at a higher than average risk for cardiovascular disease due to obesity.   Assessment/Plan:   1. Insulin resistance Siddarth will continue to work on weight loss, exercise, and decreasing simple carbohydrates to help decrease the risk of diabetes. Kendrik agreed to follow-up with [place] as directed to closely monitor his progress.  2. Pure hypercholesterolemia Cardiovascular risk and specific lipid/LDL goals reviewed.  We discussed several lifestyle modifications today and Werner will continue to work on diet, exercise and weight loss efforts. Orders and follow up as documented in patient record.   Counseling Intensive lifestyle modifications are the first line treatment for this issue. . Dietary changes: Increase soluble fiber. Decrease simple carbohydrates. . Exercise changes: Moderate to vigorous-intensity aerobic activity 150 minutes per week if tolerated. . Lipid-lowering medications: see documented in medical record.  3. Vitamin D deficiency Low Vitamin D level contributes to fatigue and are associated with obesity, breast, and colon cancer. He agrees to continue to take prescription Vitamin D @50 ,000 IU every week and will follow-up for routine testing of Vitamin D, at least 2-3 times per year to avoid over-replacement.  4. Other depression, with emotional eating  Behavior modification techniques were discussed today to help Brad Thompson deal with his emotional/non-hunger eating behaviors.  Orders and follow up as  documented in patient record.   Meds ordered this encounter  Medications  . buPROPion (WELLBUTRIN SR) 150 MG 12 hr tablet    Sig: Take 1 tablet (150 mg total) by mouth daily.    Dispense:  90 tablet    Refill:  0   5. At risk for heart disease Brad Thompson was given approximately 15 minutes of coronary artery disease  prevention counseling today. He is 49 y.o. male and has risk factors for heart disease including obesity. We discussed intensive lifestyle modifications today with an emphasis on specific weight loss instructions and strategies.   Repetitive spaced learning was employed today to elicit superior memory formation and behavioral change.  6. Class 2 severe obesity with serious comorbidity and body mass index (BMI) of 36.0 to 36.9 in adult, unspecified obesity type [facility]) Brad Thompson is currently in the action stage of change. As such, his goal is to continue with weight loss efforts. He has agreed to the Category 3 Plan.   Exercise goals: For substantial health benefits, adults should do at least 150 minutes (2 hours and 30 minutes) a week of moderate-intensity, or 75 minutes (1 hour and 15 minutes) a week of vigorous-intensity aerobic physical activity, or an equivalent combination of moderate- and vigorous-intensity aerobic activity. Aerobic activity should be performed in episodes of at least 10 minutes, and preferably, it should be spread throughout the week. Increase strength training.  Behavioral modification strategies: increasing water intake.  [name] has agreed to follow-up with our clinic in 4 weeks. He was informed of the importance of frequent follow-up visits to maximize his success with intensive lifestyle modifications for his multiple health conditions.   Objective:   Blood pressure 117/61, pulse 72, temperature 98.1 F (36.7 C), temperature source Oral, height 5\' 11"  (1.803 m), weight 261 lb (118.4 kg), SpO2 97 %. Body mass index is 36.4 kg/m.  General: Cooperative, alert, well developed, in no acute distress. HEENT: Conjunctivae and lids unremarkable. Cardiovascular: Regular rhythm.  Lungs: Normal work of breathing. Neurologic: No focal deficits.   Lab Results  Component Value Date   CREATININE 0.99 09/03/2019   BUN 13 09/03/2019   NA 138 09/03/2019   K 4.5 09/03/2019   CL 103  09/03/2019   CO2 22 09/03/2019   Lab Results  Component Value Date   ALT 43 09/03/2019   AST 29 09/03/2019   ALKPHOS 59 09/03/2019   BILITOT 0.4 09/03/2019   Lab Results  Component Value Date   HGBA1C 5.3 09/03/2019   Lab Results  Component Value Date   INSULIN 28.9 (H) 09/03/2019   Lab Results  Component Value Date   TSH 2.200 09/03/2019   Lab Results  Component Value Date   CHOL 202 (H) 09/03/2019   HDL 45 09/03/2019   LDLCALC 144 (H) 09/03/2019   TRIG 70 09/03/2019   CHOLHDL 4.5 09/03/2019   Lab Results  Component Value Date   WBC 9.2 09/03/2019   HGB 11.5 (L) 09/03/2019   HCT 35.6 (L) 09/03/2019   MCV 81 09/03/2019   PLT 399 09/03/2019   Attestation Statements:   Reviewed by clinician on day of visit: allergies, medications, problem list, medical history, surgical history, family history, social history, and previous encounter notes.  I, Water quality scientist, CMA, am acting as transcriptionist for Briscoe Deutscher, DO  I have reviewed the above documentation for accuracy and completeness, and I agree with the above. Briscoe Deutscher, DO

## 2019-11-30 ENCOUNTER — Other Ambulatory Visit (INDEPENDENT_AMBULATORY_CARE_PROVIDER_SITE_OTHER): Payer: Self-pay | Admitting: Family Medicine

## 2019-11-30 DIAGNOSIS — F3289 Other specified depressive episodes: Secondary | ICD-10-CM

## 2019-12-02 MED ORDER — BUPROPION HCL ER (SR) 150 MG PO TB12: 150.0000 mg | ORAL_TABLET | Freq: Every day | ORAL | 0 refills | Status: DC

## 2019-12-25 ENCOUNTER — Encounter (INDEPENDENT_AMBULATORY_CARE_PROVIDER_SITE_OTHER): Payer: Self-pay | Admitting: Family Medicine

## 2019-12-25 ENCOUNTER — Other Ambulatory Visit: Payer: Self-pay

## 2019-12-25 ENCOUNTER — Ambulatory Visit (INDEPENDENT_AMBULATORY_CARE_PROVIDER_SITE_OTHER): Payer: BC Managed Care – PPO | Admitting: Family Medicine

## 2019-12-25 VITALS — BP 119/80 | HR 80 | Temp 98.5°F | Ht 71.0 in | Wt 260.0 lb

## 2019-12-25 DIAGNOSIS — E7849 Other hyperlipidemia: Secondary | ICD-10-CM | POA: Diagnosis not present

## 2019-12-25 DIAGNOSIS — E559 Vitamin D deficiency, unspecified: Secondary | ICD-10-CM

## 2019-12-25 DIAGNOSIS — E88819 Insulin resistance, unspecified: Secondary | ICD-10-CM

## 2019-12-25 DIAGNOSIS — Z6836 Body mass index (BMI) 36.0-36.9, adult: Secondary | ICD-10-CM

## 2019-12-25 DIAGNOSIS — E538 Deficiency of other specified B group vitamins: Secondary | ICD-10-CM | POA: Diagnosis not present

## 2019-12-25 DIAGNOSIS — Z9189 Other specified personal risk factors, not elsewhere classified: Secondary | ICD-10-CM

## 2019-12-25 DIAGNOSIS — E8881 Metabolic syndrome: Secondary | ICD-10-CM

## 2019-12-25 DIAGNOSIS — F4323 Adjustment disorder with mixed anxiety and depressed mood: Secondary | ICD-10-CM

## 2019-12-25 NOTE — Progress Notes (Signed)
Chief Complaint:   OBESITY Brad Thompson is here to discuss his progress with his obesity treatment plan along with follow-up of his obesity related diagnoses. Brad Thompson is on the Category 3 Plan and states he is following his eating plan approximately 80-85% of the time. Brad Thompson states he is exercising for 0 minutes 0 times per week.  Today's visit was #: 6 Starting weight: 297 lbs Starting date: 09/03/2019 Today's weight: 260 lbs Today's date: 12/25/2019 Total lbs lost to date: 37 lbs Total lbs lost since last in-office visit: 1 lb  Interim History: Brad Thompson says he is not taking Wellbutrin. He is still interested in therapy but has not received a call yet.   Subjective:   1. Insulin resistance Brad Thompson has a diagnosis of insulin resistance based on his elevated fasting insulin level >5. He continues to work on diet and exercise to decrease his risk of diabetes.  Lab Results  Component Value Date   INSULIN 28.9 (H) 09/03/2019   Lab Results  Component Value Date   HGBA1C 5.3 09/03/2019   2. Other hyperlipidemia Brad Thompson has hyperlipidemia and has been trying to improve his cholesterol levels with intensive lifestyle modification including a low saturated fat diet, exercise and weight loss. He denies any chest pain, claudication or myalgias.  Lab Results  Component Value Date   ALT 43 09/03/2019   AST 29 09/03/2019   ALKPHOS 59 09/03/2019   BILITOT 0.4 09/03/2019   Lab Results  Component Value Date   CHOL 202 (H) 09/03/2019   HDL 45 09/03/2019   LDLCALC 144 (H) 09/03/2019   TRIG 70 09/03/2019   CHOLHDL 4.5 09/03/2019   3. Vitamin D deficiency Brad Thompson's Vitamin D level was 23.2 on 09/03/2019. He is currently taking prescription vitamin D 50,000 IU each week. He denies nausea, vomiting or muscle weakness.  4. Situational mixed anxiety and depressive disorder Brad Thompson is struggling with emotional eating and using food for comfort to the extent that it is negatively impacting his health. He has been  working on behavior modification techniques to help reduce his emotional eating and has been successful. He shows no sign of suicidal or homicidal ideations.  Assessment/Plan:   1. Insulin resistance Brad Thompson will continue to work on weight loss, exercise, and decreasing simple carbohydrates to help decrease the risk of diabetes. Keynan agreed to follow-up with [place] as directed to closely monitor his progress.  2. Other hyperlipidemia Cardiovascular risk and specific lipid/LDL goals reviewed.  We discussed several lifestyle modifications today and Brad Thompson will continue to work on diet, exercise and weight loss efforts. Orders and follow up as documented in patient record.   Counseling Intensive lifestyle modifications are the first line treatment for this issue. . Dietary changes: Increase soluble fiber. Decrease simple carbohydrates. . Exercise changes: Moderate to vigorous-intensity aerobic activity 150 minutes per week if tolerated. . Lipid-lowering medications: see documented in medical record.  3. Vitamin D deficiency Low Vitamin D level contributes to fatigue and are associated with obesity, breast, and colon cancer. He agrees to continue to take prescription Vitamin D @50 ,000 IU every week and will follow-up for routine testing of Vitamin D, at least 2-3 times per year to avoid over-replacement.  4. Situational mixed anxiety and depressive disorder Behavior modification techniques were discussed today to help Brad Thompson deal with his emotional/non-hunger eating behaviors.  Orders and follow up as documented in patient record.   Orders - Ambulatory referral to Behavioral Health  5. At risk for heart disease Brad Thompson was  given approximately 15 minutes of coronary artery disease prevention counseling today. He is 49 y.o. male and has risk factors for heart disease including obesity. We discussed intensive lifestyle modifications today with an emphasis on specific weight loss instructions and strategies.    Repetitive spaced learning was employed today to elicit superior memory formation and behavioral change.  6. Class 2 severe obesity with serious comorbidity and body mass index (BMI) of 36.0 to 36.9 in adult, unspecified obesity type [facility]) Brad Thompson is currently in the action stage of change. As such, his goal is to continue with weight loss efforts. He has agreed to the Category 3 Plan.   Exercise goals: For substantial health benefits, adults should do at least 150 minutes (2 hours and 30 minutes) a week of moderate-intensity, or 75 minutes (1 hour and 15 minutes) a week of vigorous-intensity aerobic physical activity, or an equivalent combination of moderate- and vigorous-intensity aerobic activity. Aerobic activity should be performed in episodes of at least 10 minutes, and preferably, it should be spread throughout the week. Download [employer] HIIT app.  Behavioral modification strategies: increasing lean protein intake and decreasing simple carbohydrates.  Brad Thompson has agreed to follow-up with our clinic in 4 weeks. He was informed of the importance of frequent follow-up visits to maximize his success with intensive lifestyle modifications for his multiple health conditions.   Orders Placed This Encounter  Procedures  . Comprehensive metabolic panel  . CBC with Differential/Platelet  . Insulin, random  . Lipid Panel With LDL/HDL Ratio  . VITAMIN D 25 Hydroxy (Vit-D Deficiency, Fractures)  . Vitamin B12  . Ambulatory referral to Behavioral Health   Objective:   Blood pressure 119/80, pulse 80, temperature 98.5 F (36.9 C), temperature source Oral, height 5\' 11"  (1.803 m), weight 260 lb (117.9 kg), SpO2 96 %. Body mass index is 36.26 kg/m.  General: Cooperative, alert, well developed, in no acute distress. HEENT: Conjunctivae and lids unremarkable. Cardiovascular: Regular rhythm.  Lungs: Normal work of breathing. Neurologic: No focal deficits.   Lab Results  Component Value  Date   CREATININE 0.99 09/03/2019   BUN 13 09/03/2019   NA 138 09/03/2019   K 4.5 09/03/2019   CL 103 09/03/2019   CO2 22 09/03/2019   Lab Results  Component Value Date   ALT 43 09/03/2019   AST 29 09/03/2019   ALKPHOS 59 09/03/2019   BILITOT 0.4 09/03/2019   Lab Results  Component Value Date   HGBA1C 5.3 09/03/2019   Lab Results  Component Value Date   INSULIN 28.9 (H) 09/03/2019   Lab Results  Component Value Date   TSH 2.200 09/03/2019   Lab Results  Component Value Date   CHOL 202 (H) 09/03/2019   HDL 45 09/03/2019   LDLCALC 144 (H) 09/03/2019   TRIG 70 09/03/2019   CHOLHDL 4.5 09/03/2019   Lab Results  Component Value Date   WBC 9.2 09/03/2019   HGB 11.5 (L) 09/03/2019   HCT 35.6 (L) 09/03/2019   MCV 81 09/03/2019   PLT 399 09/03/2019   Attestation Statements:   Reviewed by clinician on day of visit: allergies, medications, problem list, medical history, surgical history, family history, social history, and previous encounter notes.  I, 09/05/2019, CMA, am acting as transcriptionist for Insurance claims handler, DO  I have reviewed the above documentation for accuracy and completeness, and I agree with the above. Helane Rima, DO

## 2019-12-26 LAB — COMPREHENSIVE METABOLIC PANEL
ALT: 31 IU/L (ref 0–44)
AST: 26 IU/L (ref 0–40)
Albumin/Globulin Ratio: 1.7 (ref 1.2–2.2)
Albumin: 4.5 g/dL (ref 4.0–5.0)
Alkaline Phosphatase: 55 IU/L (ref 48–121)
BUN/Creatinine Ratio: 10 (ref 9–20)
BUN: 10 mg/dL (ref 6–24)
Bilirubin Total: 0.6 mg/dL (ref 0.0–1.2)
CO2: 21 mmol/L (ref 20–29)
Calcium: 9.2 mg/dL (ref 8.7–10.2)
Chloride: 104 mmol/L (ref 96–106)
Creatinine, Ser: 0.97 mg/dL (ref 0.76–1.27)
GFR calc Af Amer: 108 mL/min/{1.73_m2} (ref >59)
GFR calc non Af Amer: 93 mL/min/{1.73_m2} (ref >59)
Globulin, Total: 2.6 g/dL (ref 1.5–4.5)
Glucose: 105 mg/dL — ABNORMAL HIGH (ref 65–99)
Potassium: 4.6 mmol/L (ref 3.5–5.2)
Sodium: 141 mmol/L (ref 134–144)
Total Protein: 7.1 g/dL (ref 6.0–8.5)

## 2019-12-26 LAB — CBC WITH DIFFERENTIAL/PLATELET
Basophils Absolute: 0.1 10*3/uL (ref 0.0–0.2)
Basos: 1 %
EOS (ABSOLUTE): 0.1 10*3/uL (ref 0.0–0.4)
Eos: 2 %
Hematocrit: 50.9 % (ref 37.5–51.0)
Hemoglobin: 17.7 g/dL (ref 13.0–17.7)
Immature Grans (Abs): 0 10*3/uL (ref 0.0–0.1)
Immature Granulocytes: 0 %
Lymphocytes Absolute: 2 10*3/uL (ref 0.7–3.1)
Lymphs: 26 %
MCH: 30.5 pg (ref 26.6–33.0)
MCHC: 34.8 g/dL (ref 31.5–35.7)
MCV: 88 fL (ref 79–97)
Monocytes Absolute: 0.9 10*3/uL (ref 0.1–0.9)
Monocytes: 11 %
Neutrophils Absolute: 4.7 10*3/uL (ref 1.4–7.0)
Neutrophils: 60 %
Platelets: 262 10*3/uL (ref 150–450)
RBC: 5.8 x10E6/uL (ref 4.14–5.80)
RDW: 14.1 % (ref 11.6–15.4)
WBC: 7.8 10*3/uL (ref 3.4–10.8)

## 2019-12-26 LAB — LIPID PANEL WITH LDL/HDL RATIO
Cholesterol, Total: 181 mg/dL (ref 100–199)
HDL: 41 mg/dL (ref >39)
LDL Chol Calc (NIH): 124 mg/dL — ABNORMAL HIGH (ref 0–99)
LDL/HDL Ratio: 3 ratio (ref 0.0–3.6)
Triglycerides: 86 mg/dL (ref 0–149)
VLDL Cholesterol Cal: 16 mg/dL (ref 5–40)

## 2019-12-26 LAB — VITAMIN D 25 HYDROXY (VIT D DEFICIENCY, FRACTURES): Vit D, 25-Hydroxy: 50.4 ng/mL (ref 30.0–100.0)

## 2019-12-26 LAB — VITAMIN B12: Vitamin B-12: 317 pg/mL (ref 232–1245)

## 2019-12-26 LAB — INSULIN, RANDOM: INSULIN: 28.3 u[IU]/mL — ABNORMAL HIGH (ref 2.6–24.9)

## 2020-01-15 ENCOUNTER — Ambulatory Visit (INDEPENDENT_AMBULATORY_CARE_PROVIDER_SITE_OTHER): Payer: BC Managed Care – PPO | Admitting: Family Medicine

## 2020-01-15 ENCOUNTER — Encounter (INDEPENDENT_AMBULATORY_CARE_PROVIDER_SITE_OTHER): Payer: Self-pay | Admitting: Family Medicine

## 2020-01-15 ENCOUNTER — Other Ambulatory Visit: Payer: Self-pay

## 2020-01-15 VITALS — BP 120/78 | HR 74 | Temp 98.0°F | Ht 71.0 in | Wt 259.0 lb

## 2020-01-15 DIAGNOSIS — Z6836 Body mass index (BMI) 36.0-36.9, adult: Secondary | ICD-10-CM

## 2020-01-15 DIAGNOSIS — Z9189 Other specified personal risk factors, not elsewhere classified: Secondary | ICD-10-CM

## 2020-01-15 DIAGNOSIS — E538 Deficiency of other specified B group vitamins: Secondary | ICD-10-CM | POA: Diagnosis not present

## 2020-01-15 DIAGNOSIS — E8881 Metabolic syndrome: Secondary | ICD-10-CM

## 2020-01-15 DIAGNOSIS — E559 Vitamin D deficiency, unspecified: Secondary | ICD-10-CM

## 2020-01-15 DIAGNOSIS — E7849 Other hyperlipidemia: Secondary | ICD-10-CM | POA: Diagnosis not present

## 2020-01-15 DIAGNOSIS — E88819 Insulin resistance, unspecified: Secondary | ICD-10-CM

## 2020-01-15 DIAGNOSIS — F4321 Adjustment disorder with depressed mood: Secondary | ICD-10-CM

## 2020-01-15 MED ORDER — VITAMIN D (ERGOCALCIFEROL) 1.25 MG (50000 UNIT) PO CAPS: 50000.0000 [IU] | ORAL_CAPSULE | ORAL | 0 refills | Status: DC
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
## 2020-01-16 ENCOUNTER — Ambulatory Visit (INDEPENDENT_AMBULATORY_CARE_PROVIDER_SITE_OTHER): Payer: BC Managed Care – PPO | Admitting: Psychologist

## 2020-01-16 DIAGNOSIS — F320 Major depressive disorder, single episode, mild: Secondary | ICD-10-CM | POA: Diagnosis not present

## 2020-01-29 ENCOUNTER — Ambulatory Visit (INDEPENDENT_AMBULATORY_CARE_PROVIDER_SITE_OTHER): Payer: BC Managed Care – PPO | Admitting: Psychologist

## 2020-01-29 DIAGNOSIS — F320 Major depressive disorder, single episode, mild: Secondary | ICD-10-CM

## 2020-02-12 ENCOUNTER — Other Ambulatory Visit: Payer: Self-pay

## 2020-02-12 ENCOUNTER — Encounter (INDEPENDENT_AMBULATORY_CARE_PROVIDER_SITE_OTHER): Payer: Self-pay | Admitting: Family Medicine

## 2020-02-12 ENCOUNTER — Ambulatory Visit (INDEPENDENT_AMBULATORY_CARE_PROVIDER_SITE_OTHER): Payer: BC Managed Care – PPO | Admitting: Family Medicine

## 2020-02-12 VITALS — BP 112/73 | HR 72 | Temp 98.2°F | Ht 71.0 in | Wt 255.0 lb

## 2020-02-12 DIAGNOSIS — E8881 Metabolic syndrome: Secondary | ICD-10-CM

## 2020-02-12 DIAGNOSIS — Z6835 Body mass index (BMI) 35.0-35.9, adult: Secondary | ICD-10-CM

## 2020-02-12 DIAGNOSIS — E559 Vitamin D deficiency, unspecified: Secondary | ICD-10-CM

## 2020-02-12 NOTE — Progress Notes (Signed)
Chief Complaint:   OBESITY Brad Thompson is here to discuss his progress with his obesity treatment plan along with follow-up of his obesity related diagnoses. Brad Thompson is on the Category 3 Plan and states he is following his eating plan approximately 85% of the time. Brad Thompson states he has increased his activity.  Today's visit was #: 8 Starting weight: 279 lbs Starting date: 09/03/2019 Today's weight: 255 lbs Today's date: 02/12/2020 Total lbs lost to date: 24 lbs Total lbs lost since last in-office visit: 4 lbs Total weight loss percentage to date: -8.60%  Interim History: Brad Thompson says he is working with a therapist now and finding it helpful.  He is still staying on plan.  He says he had a visit with his PCP, Dr. Nehemiah Settle, to discuss "bad smell".  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  He started to increase his protein again and it is better.  Assessment/Plan:   1. Vitamin D deficiency Current vitamin D is 50.4, tested on 12/25/2019. At goal. Optimal goal > 50 ng/dL. There is also evidence to support a goal of >70 ng/dL in patients with cancer and heart disease. Plan: Continue Vitamin D @50 ,000 IU every week with follow-up for routine testing of Vitamin D at least 2-3 times per year to avoid over-replacement.    2. Insulin resistance Not at goal. Goal is HgbA1c < 5.7 and insulin level closer to 5. Brad Thompson will continue to work on weight loss, exercise, and decreasing simple carbohydrates to help decrease the risk of diabetes. Brad Thompson agreed to follow-up with Brad Thompson as directed to closely monitor his progress.    Lab Results  Component Value Date   INSULIN 28.3 (H) 12/25/2019   INSULIN 28.9 (H) 09/03/2019   Lab Results  Component Value Date   HGBA1C 5.3 09/03/2019   3. Class 2 severe obesity with serious comorbidity and body mass index (BMI) of 35.0 to 35.9 in adult, unspecified obesity type [facility]) Brad Thompson is currently in the action stage of change. As such, his goal is to continue with weight loss  efforts. He has agreed to the Category 3 Plan.   Exercise goals: For substantial health benefits, adults should do at least 150 minutes (2 hours and 30 minutes) a week of moderate-intensity, or 75 minutes (1 hour and 15 minutes) a week of vigorous-intensity aerobic physical activity, or an equivalent combination of moderate- and vigorous-intensity aerobic activity. Aerobic activity should be performed in episodes of at least 10 minutes, and preferably, it should be spread throughout the week.  Behavioral modification strategies: increasing lean protein intake.  Brad Thompson has agreed to follow-up with our clinic in 3 weeks. He was informed of the importance of frequent follow-up visits to maximize his success with intensive lifestyle modifications for his multiple health conditions.   Objective:   Blood pressure 112/73, pulse 72, temperature 98.2 F (36.8 C), temperature source Oral, height 5\' 11"  (1.803 m), weight 255 lb (115.7 kg), SpO2 97 %. Body mass index is 35.57 kg/m.  General: Cooperative, alert, well developed, in no acute distress. HEENT: Conjunctivae and lids unremarkable. Cardiovascular: Regular rhythm.  Lungs: Normal work of breathing. Neurologic: No focal deficits.   Lab Results  Component Value Date   CREATININE 0.97 12/25/2019   BUN 10 12/25/2019   NA 141 12/25/2019   K 4.6 12/25/2019   CL 104 12/25/2019   CO2 21 12/25/2019   Lab Results  Component Value Date   ALT 31 12/25/2019   AST 26 12/25/2019  ALKPHOS 55 12/25/2019   BILITOT 0.6 12/25/2019   Lab Results  Component Value Date   HGBA1C 5.3 09/03/2019   Lab Results  Component Value Date   INSULIN 28.3 (H) 12/25/2019   INSULIN 28.9 (H) 09/03/2019   Lab Results  Component Value Date   TSH 2.200 09/03/2019   Lab Results  Component Value Date   CHOL 181 12/25/2019   HDL 41 12/25/2019   LDLCALC 124 (H) 12/25/2019   TRIG 86 12/25/2019   CHOLHDL 4.5 09/03/2019   Lab Results  Component Value Date   WBC  7.8 12/25/2019   HGB 17.7 12/25/2019   HCT 50.9 12/25/2019   MCV 88 12/25/2019   PLT 262 12/25/2019   Attestation Statements:   Reviewed by clinician on day of visit: allergies, medications, problem list, medical history, surgical history, family history, social history, and previous encounter notes.  Time spent on visit including pre-visit chart review and post-visit care and charting was 25 minutes.   I, Insurance claims handler, CMA, am acting as transcriptionist for Helane Rima, DO  I have reviewed the above documentation for accuracy and completeness, and I agree with the above. Helane Rima, DO

## 2020-03-04 ENCOUNTER — Ambulatory Visit (INDEPENDENT_AMBULATORY_CARE_PROVIDER_SITE_OTHER): Payer: BC Managed Care – PPO | Admitting: Psychologist

## 2020-03-04 DIAGNOSIS — F320 Major depressive disorder, single episode, mild: Secondary | ICD-10-CM

## 2020-03-11 ENCOUNTER — Other Ambulatory Visit: Payer: Self-pay

## 2020-03-11 ENCOUNTER — Encounter (INDEPENDENT_AMBULATORY_CARE_PROVIDER_SITE_OTHER): Payer: Self-pay | Admitting: Family Medicine

## 2020-03-11 ENCOUNTER — Ambulatory Visit (INDEPENDENT_AMBULATORY_CARE_PROVIDER_SITE_OTHER): Payer: BC Managed Care – PPO | Admitting: Family Medicine

## 2020-03-11 VITALS — BP 160/79 | HR 71 | Temp 98.4°F | Ht 71.0 in | Wt 254.0 lb

## 2020-03-11 DIAGNOSIS — Z6835 Body mass index (BMI) 35.0-35.9, adult: Secondary | ICD-10-CM | POA: Diagnosis not present

## 2020-03-11 DIAGNOSIS — E8881 Metabolic syndrome: Secondary | ICD-10-CM

## 2020-03-11 DIAGNOSIS — E559 Vitamin D deficiency, unspecified: Secondary | ICD-10-CM | POA: Diagnosis not present

## 2020-03-12 MED ORDER — VITAMIN D (ERGOCALCIFEROL) 1.25 MG (50000 UNIT) PO CAPS: 50000.0000 [IU] | ORAL_CAPSULE | ORAL | 0 refills | Status: DC

## 2020-03-12 NOTE — Progress Notes (Signed)
Chief Complaint:   OBESITY Brad Thompson is here to discuss his progress with his obesity treatment plan along with follow-up of his obesity related diagnoses. Brad Thompson is on the Category 3 Plan and states he is following his eating plan approximately 85% of the time. Brad Thompson states he is mowing the yard 1 time per week.  Today's visit was #: 9 Starting weight: 279 lbs Starting date: 09/03/2019 Today's weight: 254 lbs Today's date: 03/11/2020 Total lbs lost to date: 45 lbs Total lbs lost since last in-office visit: 1 lb Total weight loss percentage to date: -8.96%  Interim History: Brad Thompson struggles with after dinner snacking.  Plan:  He will start taking walks during the work day ([employer]).  Assessment/Plan:   1. Insulin resistance Not optimized. He will continue to focus on protein-rich, low simple carbohydrate foods. We reviewed the importance of hydration, regular exercise for stress reduction, and restorative sleep.   Lab Results  Component Value Date   INSULIN 28.3 (H) 12/25/2019   INSULIN 28.9 (H) 09/03/2019   Lab Results  Component Value Date   HGBA1C 5.3 09/03/2019   2. Vitamin D deficiency Current vitamin D is 50.4, tested on 12/25/2019. Not at goal. Optimal goal > 50 ng/dL. There is also evidence to support a goal of >70 ng/dL in patients with cancer and heart disease. Plan: Continue Vitamin D @50 ,000 IU every week with follow-up for routine testing of Vitamin D at least 2-3 times per year to avoid over-replacement.  - Vitamin D, Ergocalciferol, (DRISDOL) 1.25 MG (50000 UNIT) CAPS capsule; Take 1 capsule (50,000 Units total) by mouth every 7 (seven) days.  Dispense: 12 capsule; Refill: 0  3. Class 2 severe obesity with serious comorbidity and body mass index (BMI) of 35.0 to 35.9 in adult, unspecified obesity type [facility])  Brad Thompson is currently in the action stage of change. As such, his goal is to continue with weight loss efforts. He has agreed to the Category 3 Plan.    Exercise goals: For substantial health benefits, adults should do at least 150 minutes (2 hours and 30 minutes) a week of moderate-intensity, or 75 minutes (1 hour and 15 minutes) a week of vigorous-intensity aerobic physical activity, or an equivalent combination of moderate- and vigorous-intensity aerobic activity. Aerobic activity should be performed in episodes of at least 10 minutes, and preferably, it should be spread throughout the week.  Behavioral modification strategies: increasing lean protein intake.  Brad Thompson has agreed to follow-up with our clinic in 3 weeks. He was informed of the importance of frequent follow-up visits to maximize his success with intensive lifestyle modifications for his multiple health conditions.   Objective:   Blood pressure (!) 160/79, pulse 71, temperature 98.4 F (36.9 C), temperature source Oral, height 5\' 11"  (1.803 m), weight 254 lb (115.2 kg), SpO2 97 %. Body mass index is 35.43 kg/m.  General: Cooperative, alert, well developed, in no acute distress. HEENT: Conjunctivae and lids unremarkable. Cardiovascular: Regular rhythm.  Lungs: Normal work of breathing. Neurologic: No focal deficits.   Lab Results  Component Value Date   CREATININE 0.97 12/25/2019   BUN 10 12/25/2019   NA 141 12/25/2019   K 4.6 12/25/2019   CL 104 12/25/2019   CO2 21 12/25/2019   Lab Results  Component Value Date   ALT 31 12/25/2019   AST 26 12/25/2019   ALKPHOS 55 12/25/2019   BILITOT 0.6 12/25/2019   Lab Results  Component Value Date   HGBA1C 5.3 09/03/2019  Lab Results  Component Value Date   INSULIN 28.3 (H) 12/25/2019   INSULIN 28.9 (H) 09/03/2019   Lab Results  Component Value Date   TSH 2.200 09/03/2019   Lab Results  Component Value Date   CHOL 181 12/25/2019   HDL 41 12/25/2019   LDLCALC 124 (H) 12/25/2019   TRIG 86 12/25/2019   CHOLHDL 4.5 09/03/2019   Lab Results  Component Value Date   WBC 7.8 12/25/2019   HGB 17.7 12/25/2019    HCT 50.9 12/25/2019   MCV 88 12/25/2019   PLT 262 12/25/2019   Attestation Statements:   Reviewed by clinician on day of visit: allergies, medications, problem list, medical history, surgical history, family history, social history, and previous encounter notes.  I, Insurance claims handler, CMA, am acting as transcriptionist for Helane Rima, DO  I have reviewed the above documentation for accuracy and completeness, and I agree with the above. Helane Rima, DO

## 2020-04-01 ENCOUNTER — Encounter (INDEPENDENT_AMBULATORY_CARE_PROVIDER_SITE_OTHER): Payer: Self-pay | Admitting: Family Medicine

## 2020-04-01 ENCOUNTER — Other Ambulatory Visit: Payer: Self-pay

## 2020-04-01 ENCOUNTER — Ambulatory Visit (INDEPENDENT_AMBULATORY_CARE_PROVIDER_SITE_OTHER): Payer: BC Managed Care – PPO | Admitting: Family Medicine

## 2020-04-01 VITALS — BP 130/80 | HR 76 | Temp 98.9°F | Ht 71.0 in | Wt 256.0 lb

## 2020-04-01 DIAGNOSIS — E8881 Metabolic syndrome: Secondary | ICD-10-CM

## 2020-04-01 DIAGNOSIS — Z9189 Other specified personal risk factors, not elsewhere classified: Secondary | ICD-10-CM

## 2020-04-01 DIAGNOSIS — E559 Vitamin D deficiency, unspecified: Secondary | ICD-10-CM | POA: Diagnosis not present

## 2020-04-01 DIAGNOSIS — E65 Localized adiposity: Secondary | ICD-10-CM

## 2020-04-01 DIAGNOSIS — E538 Deficiency of other specified B group vitamins: Secondary | ICD-10-CM | POA: Diagnosis not present

## 2020-04-01 DIAGNOSIS — Z6835 Body mass index (BMI) 35.0-35.9, adult: Secondary | ICD-10-CM

## 2020-04-01 NOTE — Progress Notes (Signed)
Chief Complaint:   OBESITY Brad Thompson is here to discuss his progress with his obesity treatment plan along with follow-up of his obesity related diagnoses.   Today's visit was #: 10 Starting weight: 279 lbs Starting date: 09/03/2019 Today's weight: 256 lbs Today's date: 04/02/2020 Total lbs lost to date: 23 lbs Body mass index is 35.7 kg/m.  Total weight loss percentage to date: -8.24%  Interim History: Brad Thompson says his appetite is still controlled.  He still follows the plan for meals, but has added back a "snack" on his way home from work (peanuts/chips, estimated 500-600 calories).  He has started walking around campus (~4-5 times so far).  Nutrition Plan: Category 3 for 85-90% of the time.. Hunger is moderately controlled controlled. Cravings are moderately controlled controlled.  Activity: Walking for 40-45 minutes 2-3 times per week. Sleep: Sleep is restful.   Assessment/Plan:   1. Insulin resistance Not at goal. Goal is HgbA1c < 5.7 and insulin level closer to 5. He will continue to focus on protein-rich, low simple carbohydrate foods. We reviewed the importance of hydration, regular exercise for stress reduction, and restorative sleep. He is due for new labs at next visit.  Lab Results  Component Value Date   INSULIN 28.3 (H) 12/25/2019   INSULIN 28.9 (H) 09/03/2019   Lab Results  Component Value Date   HGBA1C 5.3 09/03/2019   2. Vitamin D deficiency Current vitamin D is 50.4, tested on 12/25/2019. At goal. Optimal goal > 50 ng/dL.   Plan: Continue Vitamin D @50 ,000 IU every week with follow-up for routine testing of Vitamin D at least 2-3 times per year to avoid over-replacement.  3. B12 deficiency The diagnosis was reviewed with the patient. We will continue to monitor. Due for labs at next visit.  Lab Results  Component Value Date   VITAMINB12 317 12/25/2019   4. Visceral obesity Current visceral fat rating: 16. Visceral adipose tissue is a hormonally active  component of total body fat. This body composition phenotype is associated with medical disorders such as metabolic syndrome, cardiovascular disease and several malignancies including prostate, breast, and colorectal cancers. Starting goal: Lose 7-10% of starting weight. Visceral fat rating should be < 13.  5. At risk for heart disease Brad Thompson was given approximately 15 minutes of coronary artery disease prevention counseling today. He is 49 y.o. male and has risk factors for heart disease including obesity and increased visceral fat. We re-reviewed discussed intensive lifestyle modifications today with an emphasis on specific weight loss instructions and strategies.   6. Class 2 severe obesity with serious comorbidity and body mass index (BMI) of 35.0 to 35.9 in adult, unspecified obesity type [facility])  Brad Thompson is currently in the action stage of change. As such, his goal is to continue with weight loss efforts.   Nutrition goals: He has agreed to the Category 3 Plan.   Exercise goals: For substantial health benefits, adults should do at least 150 minutes (2 hours and 30 minutes) a week of moderate-intensity, or 75 minutes (1 hour and 15 minutes) a week of vigorous-intensity aerobic physical activity, or an equivalent combination of moderate- and vigorous-intensity aerobic activity. Aerobic activity should be performed in episodes of at least 10 minutes, and preferably, it should be spread throughout the week.  Behavioral modification strategies: better snacking choices.  Brad Thompson has agreed to follow-up with our clinic in 3 weeks. He was informed of the importance of frequent follow-up visits to maximize his success with intensive lifestyle modifications  for his multiple health conditions.   Objective:   Blood pressure 130/80, pulse 76, temperature 98.9 F (37.2 C), temperature source Oral, height 5\' 11"  (1.803 m), weight 256 lb (116.1 kg), SpO2 97 %. Body mass index is 35.7 kg/m.  General: Cooperative,  alert, well developed, in no acute distress. HEENT: Conjunctivae and lids unremarkable. Cardiovascular: Regular rhythm.  Lungs: Normal work of breathing. Neurologic: No focal deficits.   Lab Results  Component Value Date   CREATININE 0.97 12/25/2019   BUN 10 12/25/2019   NA 141 12/25/2019   K 4.6 12/25/2019   CL 104 12/25/2019   CO2 21 12/25/2019   Lab Results  Component Value Date   ALT 31 12/25/2019   AST 26 12/25/2019   ALKPHOS 55 12/25/2019   BILITOT 0.6 12/25/2019   Lab Results  Component Value Date   HGBA1C 5.3 09/03/2019   Lab Results  Component Value Date   INSULIN 28.3 (H) 12/25/2019   INSULIN 28.9 (H) 09/03/2019   Lab Results  Component Value Date   TSH 2.200 09/03/2019   Lab Results  Component Value Date   CHOL 181 12/25/2019   HDL 41 12/25/2019   LDLCALC 124 (H) 12/25/2019   TRIG 86 12/25/2019   CHOLHDL 4.5 09/03/2019   Lab Results  Component Value Date   WBC 7.8 12/25/2019   HGB 17.7 12/25/2019   HCT 50.9 12/25/2019   MCV 88 12/25/2019   PLT 262 12/25/2019   Attestation Statements:   Reviewed by clinician on day of visit: allergies, medications, problem list, medical history, surgical history, family history, social history, and previous encounter notes.  I, 12/27/2019, CMA, am acting as transcriptionist for Insurance claims handler, DO  I have reviewed the above documentation for accuracy and completeness, and I agree with the above. Helane Rima, DO

## 2020-04-15 ENCOUNTER — Ambulatory Visit (INDEPENDENT_AMBULATORY_CARE_PROVIDER_SITE_OTHER): Payer: BC Managed Care – PPO | Admitting: Psychologist

## 2020-04-15 DIAGNOSIS — F320 Major depressive disorder, single episode, mild: Secondary | ICD-10-CM

## 2020-04-21 ENCOUNTER — Ambulatory Visit: Payer: BC Managed Care – PPO | Attending: Internal Medicine

## 2020-04-21 DIAGNOSIS — Z23 Encounter for immunization: Secondary | ICD-10-CM

## 2020-04-21 NOTE — Progress Notes (Signed)
   Covid-19 Vaccination Clinic  Name:  Venus Ruhe    MRN: [id number] DOB: 1970-09-24  04/21/2020  Mr. Blazejewski was observed post Covid-19 immunization for 15 minutes without incident. He was provided with Vaccine Information Sheet and instruction to access the V-Safe system.   Mr. Adrian was instructed to call 911 with any severe reactions post vaccine: Marland Kitchen Difficulty breathing  . Swelling of face and throat  . A fast heartbeat  . A bad rash all over body  . Dizziness and weakness

## 2020-04-27 ENCOUNTER — Encounter (INDEPENDENT_AMBULATORY_CARE_PROVIDER_SITE_OTHER): Payer: Self-pay | Admitting: Family Medicine

## 2020-04-27 ENCOUNTER — Other Ambulatory Visit: Payer: Self-pay

## 2020-04-27 ENCOUNTER — Ambulatory Visit (INDEPENDENT_AMBULATORY_CARE_PROVIDER_SITE_OTHER): Payer: BC Managed Care – PPO | Admitting: Family Medicine

## 2020-04-27 VITALS — BP 125/81 | HR 97 | Temp 98.3°F | Ht 71.0 in | Wt 256.0 lb

## 2020-04-27 DIAGNOSIS — F3289 Other specified depressive episodes: Secondary | ICD-10-CM

## 2020-04-27 DIAGNOSIS — Z6835 Body mass index (BMI) 35.0-35.9, adult: Secondary | ICD-10-CM

## 2020-04-27 DIAGNOSIS — E559 Vitamin D deficiency, unspecified: Secondary | ICD-10-CM

## 2020-04-27 NOTE — Progress Notes (Signed)
Chief Complaint:   OBESITY Brad Thompson is here to discuss his progress with his obesity treatment plan along with follow-up of his obesity related diagnoses.   Today's visit was #: 11 Starting weight: 279 lbs Starting date: 09/03/2019 Today's weight: 256 lbs Today's date: 04/27/2020 Total lbs lost to date: 23 lbs Body mass index is 35.7 kg/m.  Total weight loss percentage to date: -8.24%  Interim History: Brad Thompson is no longer getting hot peanuts from the gas station.  He is getting Diet Mtn. Dew and sour cream potato chips a few days a week.  He has been walking at work twice a week, for about an hour.  He says that he will be going to visit his half brother soon.  He is still enjoying therapy.  Nutrition Plan: the Category 3 Plan for 85% of the time.  Hunger is moderately controlled controlled. Cravings are moderately controlled controlled.  Activity: Walking for 40-45 minutes 2 times per week. Sleep: Sleep is restful.   Current Outpatient Medications:  .  APPLE CIDER VINEGAR PO, Take 1 tablet by mouth daily., Disp: , Rfl:  .  loratadine (CLARITIN) 10 MG tablet, Take 10 mg by mouth daily., Disp: , Rfl:  .  Multiple Vitamins-Minerals (MULTIVITAMIN GUMMIES MENS PO), Take 1 tablet by mouth daily., Disp: , Rfl:  .  Vitamin D, Ergocalciferol, (DRISDOL) 1.25 MG (50000 UNIT) CAPS capsule, Take 1 capsule (50,000 Units total) by mouth every 7 (seven) days., Disp: 12 capsule, Rfl: 0  Assessment/Plan:   1. Vitamin D deficiency At goal. Current vitamin D is 50.4, tested on 12/25/2019. The current medical regimen is effective;  continue present plan and medications.  Plan:  [x]   Continue Vitamin D @50 ,000 IU every week. []   Continue home supplement daily. [x]   Follow-up for routine testing of Vitamin D at least 2-3 times per year to avoid over-replacement.  2. Other depression, with emotional eating  Improving, but not optimized.  Discussed cues and consequences, how thoughts affect eating,  model of thoughts, feelings, and behaviors, and strategies for change by focusing on the cue. Discussed cognitive distortions, coping thoughts, and how to change your thoughts. Specifically regarding patient's less desirable eating habits and patterns, we employed the technique of small changes when @NAME @ has not been able to fully commit to @HIS @ prudent nutritional plan.  3. Class 2 severe obesity with serious comorbidity and body mass index (BMI) of 35.0 to 35.9 in adult, unspecified obesity type (HCC)  Course: Brad Thompson is currently in the action stage of change. As such, his goal is to continue with weight loss efforts.   Nutrition goals: He has agreed to the Category 3 Plan.   Exercise goals: For substantial health benefits, adults should do at least 150 minutes (2 hours and 30 minutes) a week of moderate-intensity, or 75 minutes (1 hour and 15 minutes) a week of vigorous-intensity aerobic physical activity, or an equivalent combination of moderate- and vigorous-intensity aerobic activity. Aerobic activity should be performed in episodes of at least 10 minutes, and preferably, it should be spread throughout the week.  Behavioral modification strategies: increasing lean protein intake, decreasing simple carbohydrates, increasing vegetables, increasing water intake and emotional eating strategies.  Brad Thompson has agreed to follow-up with our clinic in 3-4 weeks. He was informed of the importance of frequent follow-up visits to maximize his success with intensive lifestyle modifications for his multiple health conditions.   Objective:   Blood pressure 125/81, pulse 97, temperature 98.3 F (36.8 C),  height 5\' 11"  (1.803 m), weight 256 lb (116.1 kg), SpO2 (!) 77 %. Body mass index is 35.7 kg/m.  General: Cooperative, alert, well developed, in no acute distress. HEENT: Conjunctivae and lids unremarkable. Cardiovascular: Regular rhythm.  Lungs: Normal work of breathing. Neurologic: No focal deficits.    Lab Results  Component Value Date   CREATININE 0.97 12/25/2019   BUN 10 12/25/2019   NA 141 12/25/2019   K 4.6 12/25/2019   CL 104 12/25/2019   CO2 21 12/25/2019   Lab Results  Component Value Date   ALT 31 12/25/2019   AST 26 12/25/2019   ALKPHOS 55 12/25/2019   BILITOT 0.6 12/25/2019   Lab Results  Component Value Date   HGBA1C 5.3 09/03/2019   Lab Results  Component Value Date   INSULIN 28.3 (H) 12/25/2019   INSULIN 28.9 (H) 09/03/2019   Lab Results  Component Value Date   TSH 2.200 09/03/2019   Lab Results  Component Value Date   CHOL 181 12/25/2019   HDL 41 12/25/2019   LDLCALC 124 (H) 12/25/2019   TRIG 86 12/25/2019   CHOLHDL 4.5 09/03/2019   Lab Results  Component Value Date   WBC 7.8 12/25/2019   HGB 17.7 12/25/2019   HCT 50.9 12/25/2019   MCV 88 12/25/2019   PLT 262 12/25/2019   Attestation Statements:   Reviewed by clinician on day of visit: allergies, medications, problem list, medical history, surgical history, family history, social history, and previous encounter notes.  Time spent on visit including pre-visit chart review and post-visit care and charting was 30 minutes.   I, 12/27/2019, CMA, am acting as transcriptionist for Insurance claims handler, DO  I have reviewed the above documentation for accuracy and completeness, and I agree with the above. Helane Rima, DO

## 2020-05-18 ENCOUNTER — Encounter (INDEPENDENT_AMBULATORY_CARE_PROVIDER_SITE_OTHER): Payer: Self-pay | Admitting: Family Medicine

## 2020-05-18 ENCOUNTER — Ambulatory Visit (INDEPENDENT_AMBULATORY_CARE_PROVIDER_SITE_OTHER): Payer: BC Managed Care – PPO | Admitting: Family Medicine

## 2020-05-18 ENCOUNTER — Other Ambulatory Visit: Payer: Self-pay

## 2020-05-18 VITALS — BP 123/74 | HR 66 | Temp 98.3°F | Ht 71.0 in | Wt 256.0 lb

## 2020-05-18 DIAGNOSIS — E65 Localized adiposity: Secondary | ICD-10-CM

## 2020-05-18 DIAGNOSIS — E559 Vitamin D deficiency, unspecified: Secondary | ICD-10-CM | POA: Diagnosis not present

## 2020-05-18 DIAGNOSIS — Z9189 Other specified personal risk factors, not elsewhere classified: Secondary | ICD-10-CM

## 2020-05-18 DIAGNOSIS — Z6835 Body mass index (BMI) 35.0-35.9, adult: Secondary | ICD-10-CM

## 2020-05-18 DIAGNOSIS — E538 Deficiency of other specified B group vitamins: Secondary | ICD-10-CM | POA: Diagnosis not present

## 2020-05-18 DIAGNOSIS — F3289 Other specified depressive episodes: Secondary | ICD-10-CM

## 2020-05-19 NOTE — Progress Notes (Signed)
Chief Complaint:   OBESITY Brad Thompson is here to discuss his progress with his obesity treatment plan along with follow-up of his obesity related diagnoses.   Today's visit was #: 12 Starting weight: 279 lbs Starting date: 3/32/2021 Today's weight: 256 lbs Today's date: 05/18/2020 Total lbs lost to date: 23 lbs Body mass index is 35.7 kg/m.  Total weight loss percentage to date: -8.24%  Interim History: Brad Thompson says he is doing well.  He maintained over the holiday, but he is ready to get back to weight loss.  His struggle is still snacking. Nutrition Plan: the Category 3 Plan for 80-85% of the time.  Activity: Walking 1-2 miles 1-2 times per week.  Assessment/Plan:   1. Vitamin D deficiency At goal. Current vitamin D is 50.4, tested on 12/25/2019.   Plan:  [x]   Continue Vitamin D @50 ,000 IU every week. []   Continue home supplement daily. [x]   Follow-up for routine testing of Vitamin D at least 2-3 times per year to avoid over-replacement.  2. B12 deficiency Brad Thompson is taking a daily multivitamin. We will recheck his level at the next visit.  Lab Results  Component Value Date   VITAMINB12 317 12/25/2019   3. Visceral obesity Current visceral fat rating: 16. Visceral fat rating should be < 13. Visceral adipose tissue is a hormonally active component of total body fat. This body composition phenotype is associated with medical disorders such as metabolic syndrome, cardiovascular disease and several malignancies including prostate, breast, and colorectal cancers. Starting goal: Lose 7-10% of starting weight.   4. Other depression, with emotional eating  Improving.  Behavior modification techniques were discussed today to help Brad Thompson deal with his emotional/non-hunger eating behaviors.    5. At risk for heart disease Brad Thompson was given approximately 9 minutes of coronary artery disease prevention counseling today. He is 49 y.o. male and has risk factors for heart disease including obesity  and visceral obesity. We discussed intensive lifestyle modifications today with an emphasis on specific weight loss instructions and strategies. Repetitive spaced learning was employed today to elicit superior memory formation and behavioral change.  6. Class 2 severe obesity with serious comorbidity and body mass index (BMI) of 35.0 to 35.9 in adult, unspecified obesity type (HCC)  Course: Brad Thompson is currently in the action stage of change. As such, his goal is to continue with weight loss efforts.   Nutrition goals: He has agreed to the Category 3 Plan.   Exercise goals: For substantial health benefits, adults should do at least 150 minutes (2 hours and 30 minutes) a week of moderate-intensity, or 75 minutes (1 hour and 15 minutes) a week of vigorous-intensity aerobic physical activity, or an equivalent combination of moderate- and vigorous-intensity aerobic activity. Aerobic activity should be performed in episodes of at least 10 minutes, and preferably, it should be spread throughout the week.  Behavioral modification strategies: increasing lean protein intake, decreasing simple carbohydrates, increasing vegetables, increasing water intake, dealing with family or coworker sabotage, travel eating strategies and holiday eating strategies .  Brad Thompson has agreed to follow-up with our clinic in 4 weeks. He was informed of the importance of frequent follow-up visits to maximize his success with intensive lifestyle modifications for his multiple health conditions.   Objective:   Blood pressure 123/74, pulse 66, temperature 98.3 F (36.8 C), temperature source Oral, height 5\' 11"  (1.803 m), weight 256 lb (116.1 kg), SpO2 97 %. Body mass index is 35.7 kg/m.  General: Cooperative, alert, well developed, in  no acute distress. HEENT: Conjunctivae and lids unremarkable. Cardiovascular: Regular rhythm.  Lungs: Normal work of breathing. Neurologic: No focal deficits.   Lab Results  Component Value Date    CREATININE 0.97 12/25/2019   BUN 10 12/25/2019   NA 141 12/25/2019   K 4.6 12/25/2019   CL 104 12/25/2019   CO2 21 12/25/2019   Lab Results  Component Value Date   ALT 31 12/25/2019   AST 26 12/25/2019   ALKPHOS 55 12/25/2019   BILITOT 0.6 12/25/2019   Lab Results  Component Value Date   HGBA1C 5.3 09/03/2019   Lab Results  Component Value Date   INSULIN 28.3 (H) 12/25/2019   INSULIN 28.9 (H) 09/03/2019   Lab Results  Component Value Date   TSH 2.200 09/03/2019   Lab Results  Component Value Date   CHOL 181 12/25/2019   HDL 41 12/25/2019   LDLCALC 124 (H) 12/25/2019   TRIG 86 12/25/2019   CHOLHDL 4.5 09/03/2019   Lab Results  Component Value Date   WBC 7.8 12/25/2019   HGB 17.7 12/25/2019   HCT 50.9 12/25/2019   MCV 88 12/25/2019   PLT 262 12/25/2019   Attestation Statements:   Reviewed by clinician on day of visit: allergies, medications, problem list, medical history, surgical history, family history, social history, and previous encounter notes.  I, Insurance claims handler, CMA, am acting as transcriptionist for Helane Rima, DO  I have reviewed the above documentation for accuracy and completeness, and I agree with the above. Helane Rima, DO

## 2020-06-06 ENCOUNTER — Emergency Department (HOSPITAL_COMMUNITY)
Admission: EM | Admit: 2020-06-06 | Discharge: 2020-06-07 | Disposition: A | Discharge status: Home / Self Care | Payer: BC Managed Care – PPO | Attending: Emergency Medicine | Admitting: Emergency Medicine

## 2020-06-06 DIAGNOSIS — N200 Calculus of kidney: Secondary | ICD-10-CM

## 2020-06-06 DIAGNOSIS — R109 Unspecified abdominal pain: Secondary | ICD-10-CM | POA: Diagnosis present

## 2020-06-06 DIAGNOSIS — N23 Unspecified renal colic: Secondary | ICD-10-CM

## 2020-06-07 ENCOUNTER — Encounter (HOSPITAL_COMMUNITY): Payer: Self-pay | Admitting: Emergency Medicine

## 2020-06-07 ENCOUNTER — Other Ambulatory Visit: Payer: Self-pay

## 2020-06-07 ENCOUNTER — Emergency Department (HOSPITAL_COMMUNITY): Payer: BC Managed Care – PPO

## 2020-06-07 LAB — URINALYSIS, ROUTINE W REFLEX MICROSCOPIC
Bacteria, UA: NONE SEEN
Bilirubin Urine: NEGATIVE
Glucose, UA: NEGATIVE mg/dL
Ketones, ur: NEGATIVE mg/dL
Leukocytes,Ua: NEGATIVE
Nitrite: NEGATIVE
Protein, ur: NEGATIVE mg/dL
Specific Gravity, Urine: 1.019 (ref 1.005–1.030)
pH: 6 (ref 5.0–8.0)

## 2020-06-07 LAB — CBC WITH DIFFERENTIAL/PLATELET
Abs Immature Granulocytes: 0.09 10*3/uL — ABNORMAL HIGH (ref 0.00–0.07)
Basophils Absolute: 0.1 10*3/uL (ref 0.0–0.1)
Basophils Relative: 0 %
Eosinophils Absolute: 0.1 10*3/uL (ref 0.0–0.5)
Eosinophils Relative: 0 %
HCT: 43.9 % (ref 39.0–52.0)
Hemoglobin: 16.2 g/dL (ref 13.0–17.0)
Immature Granulocytes: 1 %
Lymphocytes Relative: 16 %
Lymphs Abs: 2.4 10*3/uL (ref 0.7–4.0)
MCH: 29.7 pg (ref 26.0–34.0)
MCHC: 36.9 g/dL — ABNORMAL HIGH (ref 30.0–36.0)
MCV: 80.4 fL (ref 80.0–100.0)
Monocytes Absolute: 1.3 10*3/uL — ABNORMAL HIGH (ref 0.1–1.0)
Monocytes Relative: 9 %
Neutro Abs: 11.3 10*3/uL — ABNORMAL HIGH (ref 1.7–7.7)
Neutrophils Relative %: 74 %
Platelets: 163 10*3/uL (ref 150–400)
RBC: 5.46 MIL/uL (ref 4.22–5.81)
RDW: 13.1 % (ref 11.5–15.5)
WBC: 15.2 10*3/uL — ABNORMAL HIGH (ref 4.0–10.5)
nRBC: 0 % (ref 0.0–0.2)

## 2020-06-07 LAB — BASIC METABOLIC PANEL
Anion gap: 10 (ref 5–15)
BUN: 18 mg/dL (ref 6–20)
CO2: 21 mmol/L — ABNORMAL LOW (ref 22–32)
Calcium: 9.2 mg/dL (ref 8.9–10.3)
Chloride: 106 mmol/L (ref 98–111)
Creatinine, Ser: 1.16 mg/dL (ref 0.61–1.24)
GFR, Estimated: >60 mL/min (ref >60)
Glucose, Bld: 152 mg/dL — ABNORMAL HIGH (ref 70–99)
Potassium: 4.1 mmol/L (ref 3.5–5.1)
Sodium: 137 mmol/L (ref 135–145)

## 2020-06-07 MED ORDER — TAMSULOSIN HCL 0.4 MG PO CAPS: 0.4000 mg | ORAL_CAPSULE | Freq: Every day | ORAL | 0 refills | Status: DC

## 2020-06-07 MED ORDER — OXYCODONE-ACETAMINOPHEN 5-325 MG PO TABS: 1.0000 | ORAL_TABLET | Freq: Four times a day (QID) | ORAL | 0 refills | Status: DC | PRN

## 2020-06-07 MED ORDER — ONDANSETRON HCL 4 MG PO TABS: 4.0000 mg | ORAL_TABLET | Freq: Four times a day (QID) | ORAL | 0 refills | Status: DC | PRN

## 2020-06-07 NOTE — ED Provider Notes (Signed)
[facility] EMERGENCY DEPARTMENT Provider Note   CSN: [id number] Arrival date & time: 06/06/20  2357     History Chief Complaint  Patient presents with  . Flank Pain    Jerry Clyne is a 49 y.o. male.  The history is provided by the patient.  Flank Pain   Dilyn Smiles is a 49 y.o. male who presents to the Emergency Department complaining of flank pain. He presents the emergency department complaining of right flank pain. Two days ago he noticed a mild discomfort in the right side and thought it was related to raking leaves. Last night he developed significant worsening of the pain. He felt like it was a knot in his right back. He had associated nausea with emesis times one. He denies any fevers, dysuria, hematuria. No prior similar symptoms. He has no significant medical problems and takes no medications. During his ED stay his pain significantly improved.    Past Medical History:  Diagnosis Date  . Constipation   . High cholesterol   . Joint pain   . Multiple food allergies   . Sleep apnea     There are no problems to display for this patient.   Past Surgical History:  Procedure Laterality Date  . wisdom teeth removal         Family History  Problem Relation Age of Onset  . Obesity Mother   . Cancer Mother   . Sudden death Father   . Heart disease Father   . Alcoholism Father     Social History   Tobacco Use  . Smoking status: Never Smoker  . Smokeless tobacco: Never Used  Vaping Use  . Vaping Use: Never used  Substance Use Topics  . Alcohol use: No  . Drug use: No    Home Medications Prior to Admission medications   Medication Sig Start Date End Date Taking? Authorizing Provider  APPLE CIDER VINEGAR PO Take 1 tablet by mouth daily.    [provider]  loratadine (CLARITIN) 10 MG tablet Take 10 mg by mouth daily.    [provider]  Multiple Vitamins-Minerals (MULTIVITAMIN GUMMIES MENS PO) Take 1 tablet by  mouth daily.    [provider]  ondansetron (ZOFRAN) 4 MG tablet Take 1 tablet (4 mg total) by mouth every 6 (six) hours as needed for nausea or vomiting. 06/07/20   Tilden Fossa, MD  oxyCODONE-acetaminophen (PERCOCET/ROXICET) 5-325 MG tablet Take 1 tablet by mouth every 6 (six) hours as needed for severe pain. 06/07/20   Tilden Fossa, MD  tamsulosin (FLOMAX) 0.4 MG CAPS capsule Take 1 capsule (0.4 mg total) by mouth daily. 06/07/20   Tilden Fossa, MD  Vitamin D, Ergocalciferol, (DRISDOL) 1.25 MG (50000 UNIT) CAPS capsule Take 1 capsule (50,000 Units total) by mouth every 7 (seven) days. 03/12/20   Helane Rima, DO    Allergies    Patient has no known allergies.  Review of Systems   Review of Systems  Genitourinary: Positive for flank pain.  All other systems reviewed and are negative.   Physical Exam Updated Vital Signs BP 123/75 (BP Location: Left Arm)   Pulse 71   Temp 97.9 F (36.6 C)   Resp 18   Ht 6' (1.829 m)   Wt 125 kg   SpO2 99%   BMI 37.37 kg/m   Physical Exam Vitals and nursing note reviewed.  Constitutional:      Appearance: He is well-developed and well-nourished.  HENT:  Head: Normocephalic and atraumatic.  Cardiovascular:     Rate and Rhythm: Normal rate and regular rhythm.     Heart sounds: No murmur heard.   Pulmonary:     Effort: Pulmonary effort is normal. No respiratory distress.     Breath sounds: Normal breath sounds.  Abdominal:     Palpations: Abdomen is soft.     Tenderness: There is no abdominal tenderness. There is no right CVA tenderness, left CVA tenderness, guarding or rebound.  Musculoskeletal:        General: No tenderness or edema.  Skin:    General: Skin is warm and dry.  Neurological:     Mental Status: He is alert and oriented to person, place, and time.  Psychiatric:        Mood and Affect: Mood and affect normal.        Behavior: Behavior normal.     ED Results / Procedures / Treatments    Labs (all labs ordered are listed, but only abnormal results are displayed) Labs Reviewed  CBC WITH DIFFERENTIAL/PLATELET - Abnormal; Notable for the following components:      Result Value   WBC 15.2 (*)    MCHC 36.9 (*)    Neutro Abs 11.3 (*)    Monocytes Absolute 1.3 (*)    Abs Immature Granulocytes 0.09 (*)    All other components within normal limits  BASIC METABOLIC PANEL - Abnormal; Notable for the following components:   CO2 21 (*)    Glucose, Bld 152 (*)    All other components within normal limits  URINALYSIS, ROUTINE W REFLEX MICROSCOPIC - Abnormal; Notable for the following components:   Hgb urine dipstick MODERATE (*)    All other components within normal limits    EKG None  Radiology CT Renal Stone Study  Result Date: 06/07/2020 CLINICAL DATA:  Right flank pain with vomiting. EXAM: CT ABDOMEN AND PELVIS WITHOUT CONTRAST TECHNIQUE: Multidetector CT imaging of the abdomen and pelvis was performed following the standard protocol without IV contrast. COMPARISON:  04/25/2014 FINDINGS: Lower chest: Unremarkable. Hepatobiliary: 7 mm hypoattenuating lesion in the anterior hepatic dome is too small to characterize but likely benign. There is no evidence for gallstones, gallbladder wall thickening, or pericholecystic fluid. No intrahepatic or extrahepatic biliary dilation. Pancreas: No focal mass lesion. No dilatation of the main duct. No intraparenchymal cyst. No peripancreatic edema. Spleen: No splenomegaly. No focal mass lesion. Adrenals/Urinary Tract: No adrenal nodule or mass. Left kidney and ureter unremarkable. No right renal stones. There is mild right hydroureteronephrosis with 4 mm stone identified at the right UVJ. No evidence for calcified stone free within the bladder lumen. Stomach/Bowel: Stomach is unremarkable. No gastric wall thickening. No evidence of outlet obstruction. Duodenum is normally positioned as is the ligament of Treitz. No small bowel wall thickening. No  small bowel dilatation. The terminal ileum is normal. The appendix is normal. No gross colonic mass. No colonic wall thickening. Vascular/Lymphatic: No abdominal aortic aneurysm. No abdominal aortic atherosclerotic calcification. There is no gastrohepatic or hepatoduodenal ligament lymphadenopathy. No retroperitoneal or mesenteric lymphadenopathy. No pelvic sidewall lymphadenopathy. Reproductive: The prostate gland and seminal vesicles are unremarkable. Other: No intraperitoneal free fluid. Musculoskeletal: No worrisome lytic or sclerotic osseous abnormality. IMPRESSION: 1. 4 mm right UVJ stone causes mild right hydroureteronephrosis. No other urinary stone disease evident. 2. 7 mm hypoattenuating lesion in the anterior hepatic dome is too small to characterize but likely benign. Electronically Signed   By: Jamison Oka.D.  On: 06/07/2020 05:18    Procedures Procedures (including critical care time)  Medications Ordered in ED Medications - No data to display  ED Course  I have reviewed the triage vital signs and the nursing notes.  Pertinent labs & imaging results that were available during my care of the patient were reviewed by me and considered in my medical decision making (see chart for details).    MDM Rules/Calculators/A&P                         patient here for evaluation of right flank pain. UA with hematuria, no additional evidence of UTI. CBC with mild leukocytosis. BMP within normal limits. CT stone study demonstrates a right ureteral stone, 4 mm at the UV J. Discussed with patient findings of studies. Discussed home care for renal colic, outpatient follow-up and return precautions. Current presentation is not consistent with acute infected stone.  Final Clinical Impression(s) / ED Diagnoses Final diagnoses:  Kidney stone  Ureteral colic    Rx / DC Orders ED Discharge Orders         Ordered    oxyCODONE-acetaminophen (PERCOCET/ROXICET) 5-325 MG tablet  Every 6 hours PRN         06/07/20 0756    ondansetron (ZOFRAN) 4 MG tablet  Every 6 hours PRN        06/07/20 0756    tamsulosin (FLOMAX) 0.4 MG CAPS capsule  Daily        06/07/20 0756           Tilden Fossa, MD 06/07/20 [phone]

## 2020-06-07 NOTE — ED Triage Notes (Signed)
Patient reports right flank pain today with emesis x1 , denies injury , no hematuria or fever .

## 2020-06-07 NOTE — ED Notes (Signed)
ED Provider at bedside. 

## 2020-06-07 NOTE — Discharge Instructions (Addendum)
You have a 40mm stone in your right ureter.  Get rechecked immediately if you develop fevers, uncontrolled pain, cannot urinate, or new concerning symptoms.

## 2020-06-16 ENCOUNTER — Other Ambulatory Visit: Payer: Self-pay

## 2020-06-16 ENCOUNTER — Encounter (INDEPENDENT_AMBULATORY_CARE_PROVIDER_SITE_OTHER): Payer: Self-pay | Admitting: Family Medicine

## 2020-06-16 ENCOUNTER — Ambulatory Visit (INDEPENDENT_AMBULATORY_CARE_PROVIDER_SITE_OTHER): Payer: BC Managed Care – PPO | Admitting: Family Medicine

## 2020-06-16 VITALS — BP 143/77 | HR 87 | Temp 98.6°F | Ht 71.0 in | Wt 257.0 lb

## 2020-06-16 DIAGNOSIS — N200 Calculus of kidney: Secondary | ICD-10-CM

## 2020-06-16 DIAGNOSIS — E8881 Metabolic syndrome: Secondary | ICD-10-CM

## 2020-06-16 DIAGNOSIS — E559 Vitamin D deficiency, unspecified: Secondary | ICD-10-CM

## 2020-06-16 DIAGNOSIS — Z6835 Body mass index (BMI) 35.0-35.9, adult: Secondary | ICD-10-CM

## 2020-06-16 DIAGNOSIS — Z9189 Other specified personal risk factors, not elsewhere classified: Secondary | ICD-10-CM

## 2020-06-16 DIAGNOSIS — E65 Localized adiposity: Secondary | ICD-10-CM | POA: Diagnosis not present

## 2020-06-18 NOTE — Progress Notes (Signed)
Chief Complaint:   OBESITY Brad Thompson is here to discuss his progress with his obesity treatment plan along with follow-up of his obesity related diagnoses.   Today's visit was #: 13 Starting weight: 279 lbs Starting date: 09/03/2019 Today's weight: 257 lbs Today's date: 06/16/2020 Total lbs lost to date: 22 lbs Body mass index is 35.84 kg/m.  Total weight loss percentage to date: -7.89%  Interim History: Brad Thompson says he enjoyed the holidays.  He was still active, he says, but overindulged (with family influence).  He is now back to strict Category 3.  He says he had a recent kidney stone. Nutrition Plan: the Category 3 Plan for 80% of the time.  Activity: Yard work.  Assessment/Plan:   1. Kidney stone Pain resolved, but he does not feel that the stone passed, 4 mm.    Plan:  We discussed symptomatic care and red flags.  2. Visceral obesity Current visceral fat rating: 16. Visceral fat rating should be < 13. Visceral adipose tissue is a hormonally active component of total body fat. This body composition phenotype is associated with medical disorders such as metabolic syndrome, cardiovascular disease and several malignancies including prostate, breast, and colorectal cancers. Starting goal: Lose 7-10% of starting weight. We will continue to monitor visceral fat rating with weight loss.  3. Insulin resistance Not at goal. Goal is HgbA1c < 5.7, fasting insulin closer to 5.  He will continue to focus on protein-rich, low simple carbohydrate foods. We reviewed the importance of hydration, regular exercise for stress reduction, and restorative sleep.   Plan:  We discussed medication - metformin, Saxenda.  He would like to wait.  Lab Results  Component Value Date   HGBA1C 5.3 09/03/2019   Lab Results  Component Value Date   INSULIN 28.3 (H) 12/25/2019   INSULIN 28.9 (H) 09/03/2019   4. Vitamin D deficiency At goal. Current vitamin D is 50.4, tested on 12/25/2019. Optimal goal > 50  ng/dL.   Plan:  [x]   Continue Vitamin D @50 ,000 IU every week. []   Continue home supplement daily. [x]   Follow-up for routine testing of Vitamin D at least 2-3 times per year to avoid over-replacement.  5. At risk for constipation Brad Thompson was given approximately 10 minutes of counseling today regarding prevention of constipation. He was encouraged to increase water and fiber intake.   6. Class 2 severe obesity with serious comorbidity and body mass index (BMI) of 35.0 to 35.9 in adult, unspecified obesity type (HCC)  Course: Brad Thompson is currently in the action stage of change. As such, his goal is to continue with weight loss efforts.   Nutrition goals: He has agreed to the Category 3 Plan.   Exercise goals: For substantial health benefits, adults should do at least 150 minutes (2 hours and 30 minutes) a week of moderate-intensity, or 75 minutes (1 hour and 15 minutes) a week of vigorous-intensity aerobic physical activity, or an equivalent combination of moderate- and vigorous-intensity aerobic activity. Aerobic activity should be performed in episodes of at least 10 minutes, and preferably, it should be spread throughout the week.  Behavioral modification strategies: increasing lean protein intake, decreasing simple carbohydrates, increasing vegetables, increasing water intake, dealing with family or coworker sabotage and holiday eating strategies .  Brad Thompson has agreed to follow-up with our clinic in 4 weeks. He was informed of the importance of frequent follow-up visits to maximize his success with intensive lifestyle modifications for his multiple health conditions.   Objective:  Blood pressure (!) 143/77, pulse 87, temperature 98.6 F (37 C), temperature source Oral, height 5\' 11"  (1.803 m), weight 257 lb (116.6 kg), SpO2 97 %. Body mass index is 35.84 kg/m.  General: Cooperative, alert, well developed, in no acute distress. HEENT: Conjunctivae and lids unremarkable. Cardiovascular:  Regular rhythm.  Lungs: Normal work of breathing. Neurologic: No focal deficits.   Lab Results  Component Value Date   CREATININE 1.16 06/07/2020   BUN 18 06/07/2020   NA 137 06/07/2020   K 4.1 06/07/2020   CL 106 06/07/2020   CO2 21 (L) 06/07/2020   Lab Results  Component Value Date   ALT 31 12/25/2019   AST 26 12/25/2019   ALKPHOS 55 12/25/2019   BILITOT 0.6 12/25/2019   Lab Results  Component Value Date   HGBA1C 5.3 09/03/2019   Lab Results  Component Value Date   INSULIN 28.3 (H) 12/25/2019   INSULIN 28.9 (H) 09/03/2019   Lab Results  Component Value Date   TSH 2.200 09/03/2019   Lab Results  Component Value Date   CHOL 181 12/25/2019   HDL 41 12/25/2019   LDLCALC 124 (H) 12/25/2019   TRIG 86 12/25/2019   CHOLHDL 4.5 09/03/2019   Lab Results  Component Value Date   WBC 15.2 (H) 06/07/2020   HGB 16.2 06/07/2020   HCT 43.9 06/07/2020   MCV 80.4 06/07/2020   PLT 163 06/07/2020   Attestation Statements:   Reviewed by clinician on day of visit: allergies, medications, problem list, medical history, surgical history, family history, social history, and previous encounter notes.  I, 06/09/2020, CMA, am acting as transcriptionist for Insurance claims handler, DO  I have reviewed the above documentation for accuracy and completeness, and I agree with the above. Helane Rima, DO

## 2020-06-24 ENCOUNTER — Ambulatory Visit (INDEPENDENT_AMBULATORY_CARE_PROVIDER_SITE_OTHER): Payer: BC Managed Care – PPO | Admitting: Psychologist

## 2020-06-24 ENCOUNTER — Encounter (INDEPENDENT_AMBULATORY_CARE_PROVIDER_SITE_OTHER): Payer: Self-pay

## 2020-06-24 DIAGNOSIS — F320 Major depressive disorder, single episode, mild: Secondary | ICD-10-CM

## 2020-06-29 ENCOUNTER — Telehealth (INDEPENDENT_AMBULATORY_CARE_PROVIDER_SITE_OTHER): Payer: BC Managed Care – PPO | Admitting: Family Medicine

## 2020-07-14 ENCOUNTER — Ambulatory Visit (INDEPENDENT_AMBULATORY_CARE_PROVIDER_SITE_OTHER): Payer: BC Managed Care – PPO | Admitting: Family Medicine

## 2020-07-14 ENCOUNTER — Ambulatory Visit (INDEPENDENT_AMBULATORY_CARE_PROVIDER_SITE_OTHER): Payer: BC Managed Care – PPO | Admitting: Physician Assistant

## 2020-07-14 ENCOUNTER — Encounter (INDEPENDENT_AMBULATORY_CARE_PROVIDER_SITE_OTHER): Payer: Self-pay

## 2020-07-14 ENCOUNTER — Other Ambulatory Visit: Payer: Self-pay

## 2020-07-14 ENCOUNTER — Encounter (INDEPENDENT_AMBULATORY_CARE_PROVIDER_SITE_OTHER): Payer: Self-pay | Admitting: Physician Assistant

## 2020-07-14 VITALS — BP 133/86 | HR 84 | Temp 98.6°F | Ht 71.0 in | Wt 259.0 lb

## 2020-07-14 DIAGNOSIS — E559 Vitamin D deficiency, unspecified: Secondary | ICD-10-CM | POA: Diagnosis not present

## 2020-07-14 DIAGNOSIS — E8881 Metabolic syndrome: Secondary | ICD-10-CM | POA: Diagnosis not present

## 2020-07-14 DIAGNOSIS — Z6836 Body mass index (BMI) 36.0-36.9, adult: Secondary | ICD-10-CM

## 2020-07-15 NOTE — Progress Notes (Unsigned)
Chief Complaint:   OBESITY Brad Thompson is here to discuss his progress with his obesity treatment plan along with follow-up of his obesity related diagnoses. Brad Thompson is on the Category 3 Plan and states he is following his eating plan approximately 70-80% of the time. Brad Thompson states he is doing 0 minutes 0 times per week.  Today's visit was #: 14 Starting weight: 279 lbs Starting date: 09/03/2019 Today's weight: 259 lbs Today's date: 07/14/2020 Total lbs lost to date: 20 Total lbs lost since last in-office visit: 0  Interim History: Brad Thompson reports that he hasn't been walking as often due to the weather. He does not think that he has been getting all of his food in on the plan consistently. He is hungry between 4-5 pm daily.  Subjective:   1. Vitamin D deficiency Brad Thompson's last Vit D level was at goal. He reports that he is not taking any Vit D supplementation.  2. Insulin resistance Brad Thompson is not on medications, and he denies polyphagia.  Assessment/Plan:   1. Vitamin D deficiency Low Vitamin D level contributes to fatigue and are associated with obesity, breast, and colon cancer. We will recheck his Vitamin D level at his next office visit. Brad Thompson will follow-up for routine testing of Vitamin D, at least 2-3 times per year to avoid over-replacement.  2. Insulin resistance Brad Thompson will continue his meal plan, and will continue to work on weight loss, exercise, and decreasing simple carbohydrates to help decrease the risk of diabetes. Brad Thompson agreed to follow-up with [place] as directed to closely monitor his progress.  3. Class 2 severe obesity with serious comorbidity and body mass index (BMI) of 36.0 to 36.9 in adult, unspecified obesity type Brad Thompson) Brad Thompson is currently in the action stage of change. As such, his goal is to continue with weight loss efforts. He has agreed to the Category 3 Plan + 6 oz of protein at lunch + 100 extra protein calories.   Brad Thompson will consider changing to Category 4.  Exercise  goals: No exercise has been prescribed at this time.  Behavioral modification strategies: meal planning and cooking strategies.  Brad Thompson has agreed to follow-up with our clinic in 3 weeks. He was informed of the importance of frequent follow-up visits to maximize his success with intensive lifestyle modifications for his multiple health conditions.   Objective:   Blood pressure 133/86, pulse 84, temperature 98.6 F (37 C), height 5\' 11"  (1.803 m), weight 259 lb (117.5 kg), SpO2 97 %. Body mass index is 36.12 kg/m.  General: Cooperative, alert, well developed, in no acute distress. HEENT: Conjunctivae and lids unremarkable. Cardiovascular: Regular rhythm.  Lungs: Normal work of breathing. Neurologic: No focal deficits.   Lab Results  Component Value Date   CREATININE 1.16 06/07/2020   BUN 18 06/07/2020   NA 137 06/07/2020   K 4.1 06/07/2020   CL 106 06/07/2020   CO2 21 (L) 06/07/2020   Lab Results  Component Value Date   ALT 31 12/25/2019   AST 26 12/25/2019   ALKPHOS 55 12/25/2019   BILITOT 0.6 12/25/2019   Lab Results  Component Value Date   HGBA1C 5.3 09/03/2019   Lab Results  Component Value Date   INSULIN 28.3 (H) 12/25/2019   INSULIN 28.9 (H) 09/03/2019   Lab Results  Component Value Date   TSH 2.200 09/03/2019   Lab Results  Component Value Date   CHOL 181 12/25/2019   HDL 41 12/25/2019   LDLCALC 124 (H) 12/25/2019  TRIG 86 12/25/2019   CHOLHDL 4.5 09/03/2019   Lab Results  Component Value Date   WBC 15.2 (H) 06/07/2020   HGB 16.2 06/07/2020   HCT 43.9 06/07/2020   MCV 80.4 06/07/2020   PLT 163 06/07/2020   No results found for: IRON, TIBC, FERRITIN  Attestation Statements:   Reviewed by clinician on day of visit: allergies, medications, problem list, medical history, surgical history, family history, social history, and previous encounter notes.  Time spent on visit including pre-visit chart review and post-visit care and charting was 30  minutes.    Trude Mcburney, am acting as transcriptionist for [employer], PA-C.  I have reviewed the above documentation for accuracy and completeness, and I agree with the above. Alois Cliche, PA-C

## 2020-08-06 ENCOUNTER — Ambulatory Visit (INDEPENDENT_AMBULATORY_CARE_PROVIDER_SITE_OTHER): Payer: BC Managed Care – PPO | Admitting: Family Medicine

## 2020-08-06 ENCOUNTER — Encounter (INDEPENDENT_AMBULATORY_CARE_PROVIDER_SITE_OTHER): Payer: Self-pay | Admitting: Family Medicine

## 2020-08-06 ENCOUNTER — Other Ambulatory Visit: Payer: Self-pay

## 2020-08-06 VITALS — BP 119/71 | HR 72 | Temp 98.7°F | Ht 71.0 in | Wt 260.0 lb

## 2020-08-06 DIAGNOSIS — E559 Vitamin D deficiency, unspecified: Secondary | ICD-10-CM

## 2020-08-06 DIAGNOSIS — Z9189 Other specified personal risk factors, not elsewhere classified: Secondary | ICD-10-CM

## 2020-08-06 DIAGNOSIS — E8881 Metabolic syndrome: Secondary | ICD-10-CM

## 2020-08-06 DIAGNOSIS — F3289 Other specified depressive episodes: Secondary | ICD-10-CM

## 2020-08-06 DIAGNOSIS — Z6836 Body mass index (BMI) 36.0-36.9, adult: Secondary | ICD-10-CM

## 2020-08-10 NOTE — Progress Notes (Signed)
Chief Complaint:   OBESITY Brad Thompson is here to discuss his progress with his obesity treatment plan along with follow-up of his obesity related diagnoses.   Today's visit was #: 15 Starting weight: 279 lbs Starting date: 09/03/2019 Today's weight: 260 lbs Today's date: 08/06/2020 Total lbs lost to date: 19 lbs Body mass index is 36.26 kg/m.  Total weight loss percentage to date: -6.81%  Interim History:  Brad Thompson's weight has been slowly increasing.  He has not been exercising as much. Current Meal Plan: the Category 3 Plan for 70% of the time.  Current Exercise Plan: None.  Assessment/Plan:   1. Insulin resistance Not at goal. Goal is HgbA1c < 5.7, fasting insulin closer to 5.  Medication: None.    Plan:  He will continue to focus on protein-rich, low simple carbohydrate foods. We reviewed the importance of hydration, regular exercise for stress reduction, and restorative sleep.   Lab Results  Component Value Date   HGBA1C 5.3 09/03/2019   Lab Results  Component Value Date   INSULIN 28.3 (H) 12/25/2019   INSULIN 28.9 (H) 09/03/2019   2. Vitamin D deficiency At goal. Current vitamin D is 50.4, tested on 12/25/2019. Optimal goal > 50 ng/dL.   Plan: Continue to take prescription Vitamin D @50 ,000 IU every week as prescribed.  Follow-up for routine testing of Vitamin D, at least 2-3 times per year to avoid over-replacement.  3. Other depression, with emotional eating  Medication: None.  Plan:  Behavior modification techniques were discussed today to help deal with emotional/non-hunger eating behaviors.  4. At risk for heart disease Due to Brad Thompson's current state of health and medical condition(s), he is at a higher risk for heart disease.  This puts the patient at much greater risk to subsequently develop cardiopulmonary conditions that can significantly affect patient's quality of life in a negative manner.    At least 10 minutes were spent on counseling Brad Thompson about these  concerns today, and I stressed the importance of reversing risks factors of obesity, especially truncal and visceral fat, hypertension, hyperlipidemia, and IR.  The initial goal is to lose at least 5-10% of starting weight to help reduce these risk factors.  Counseling:  Intensive lifestyle modifications were discussed with Brad Thompson as the most appropriate first line of treatment.  he will continue to work on diet, exercise, and weight loss efforts.  We will continue to reassess these conditions on a fairly regular basis in an attempt to decrease the patient's overall morbidity and mortality.  Evidence-based interventions for health behavior change were utilized today including the discussion of self monitoring techniques, problem-solving barriers, and SMART goal setting techniques.  Specifically, regarding patient's less desirable eating habits and patterns, we employed the technique of small changes when Brad Thompson has not been able to fully commit to his prudent nutritional plan.  5. Class 2 severe obesity with serious comorbidity and body mass index (BMI) of 36.0 to 36.9 in adult, unspecified obesity type (HCC)  Course: Brad Thompson is currently in the action stage of change. As such, his goal is to continue with weight loss efforts.   Nutrition goals: He has agreed to the Category 3 Plan.   Exercise goals: For substantial health benefits, adults should do at least 150 minutes (2 hours and 30 minutes) a week of moderate-intensity, or 75 minutes (1 hour and 15 minutes) a week of vigorous-intensity aerobic physical activity, or an equivalent combination of moderate- and vigorous-intensity aerobic activity. Aerobic activity should be performed  in episodes of at least 10 minutes, and preferably, it should be spread throughout the week.  Behavioral modification strategies: increasing lean protein intake, decreasing simple carbohydrates, increasing vegetables and increasing water intake.  Brad Thompson has agreed to follow-up with  our clinic in 4 weeks. He was informed of the importance of frequent follow-up visits to maximize his success with intensive lifestyle modifications for his multiple health conditions.   Objective:   Blood pressure 119/71, pulse 72, temperature 98.7 F (37.1 C), height 5\' 11"  (1.803 m), weight 260 lb (117.9 kg), SpO2 97 %. Body mass index is 36.26 kg/m.  General: Cooperative, alert, well developed, in no acute distress. HEENT: Conjunctivae and lids unremarkable. Cardiovascular: Regular rhythm.  Lungs: Normal work of breathing. Neurologic: No focal deficits.   Lab Results  Component Value Date   CREATININE 1.16 06/07/2020   BUN 18 06/07/2020   NA 137 06/07/2020   K 4.1 06/07/2020   CL 106 06/07/2020   CO2 21 (L) 06/07/2020   Lab Results  Component Value Date   ALT 31 12/25/2019   AST 26 12/25/2019   ALKPHOS 55 12/25/2019   BILITOT 0.6 12/25/2019   Lab Results  Component Value Date   HGBA1C 5.3 09/03/2019   Lab Results  Component Value Date   INSULIN 28.3 (H) 12/25/2019   INSULIN 28.9 (H) 09/03/2019   Lab Results  Component Value Date   TSH 2.200 09/03/2019   Lab Results  Component Value Date   CHOL 181 12/25/2019   HDL 41 12/25/2019   LDLCALC 124 (H) 12/25/2019   TRIG 86 12/25/2019   CHOLHDL 4.5 09/03/2019   Lab Results  Component Value Date   WBC 15.2 (H) 06/07/2020   HGB 16.2 06/07/2020   HCT 43.9 06/07/2020   MCV 80.4 06/07/2020   PLT 163 06/07/2020   Attestation Statements:   Reviewed by clinician on day of visit: allergies, medications, problem list, medical history, surgical history, family history, social history, and previous encounter notes.  I, 06/09/2020, CMA, am acting as transcriptionist for Insurance claims handler, DO  I have reviewed the above documentation for accuracy and completeness, and I agree with the above. Helane Rima, DO N

## 2020-09-15 ENCOUNTER — Other Ambulatory Visit: Payer: Self-pay

## 2020-09-15 ENCOUNTER — Encounter (INDEPENDENT_AMBULATORY_CARE_PROVIDER_SITE_OTHER): Payer: Self-pay | Admitting: Family Medicine

## 2020-09-15 ENCOUNTER — Ambulatory Visit (INDEPENDENT_AMBULATORY_CARE_PROVIDER_SITE_OTHER): Payer: BC Managed Care – PPO | Admitting: Family Medicine

## 2020-09-15 VITALS — BP 138/77 | HR 82 | Temp 98.9°F | Ht 71.0 in | Wt 262.0 lb

## 2020-09-15 DIAGNOSIS — R0602 Shortness of breath: Secondary | ICD-10-CM | POA: Diagnosis not present

## 2020-09-15 DIAGNOSIS — E8881 Metabolic syndrome: Secondary | ICD-10-CM

## 2020-09-15 DIAGNOSIS — E559 Vitamin D deficiency, unspecified: Secondary | ICD-10-CM | POA: Diagnosis not present

## 2020-09-15 DIAGNOSIS — F3289 Other specified depressive episodes: Secondary | ICD-10-CM

## 2020-09-15 DIAGNOSIS — Z9189 Other specified personal risk factors, not elsewhere classified: Secondary | ICD-10-CM | POA: Diagnosis not present

## 2020-09-15 DIAGNOSIS — E7800 Pure hypercholesterolemia, unspecified: Secondary | ICD-10-CM | POA: Diagnosis not present

## 2020-09-15 DIAGNOSIS — E538 Deficiency of other specified B group vitamins: Secondary | ICD-10-CM | POA: Diagnosis not present

## 2020-09-15 DIAGNOSIS — E88819 Insulin resistance, unspecified: Secondary | ICD-10-CM

## 2020-09-15 DIAGNOSIS — Z6838 Body mass index (BMI) 38.0-38.9, adult: Secondary | ICD-10-CM

## 2020-09-16 DIAGNOSIS — E7800 Pure hypercholesterolemia, unspecified: Secondary | ICD-10-CM | POA: Insufficient documentation

## 2020-09-16 DIAGNOSIS — E559 Vitamin D deficiency, unspecified: Secondary | ICD-10-CM | POA: Insufficient documentation

## 2020-09-16 DIAGNOSIS — E88819 Insulin resistance, unspecified: Secondary | ICD-10-CM | POA: Insufficient documentation

## 2020-09-16 DIAGNOSIS — E8881 Metabolic syndrome: Secondary | ICD-10-CM | POA: Insufficient documentation

## 2020-09-16 DIAGNOSIS — F32A Depression, unspecified: Secondary | ICD-10-CM | POA: Insufficient documentation

## 2020-09-16 DIAGNOSIS — E538 Deficiency of other specified B group vitamins: Secondary | ICD-10-CM | POA: Insufficient documentation

## 2020-09-16 LAB — COMPREHENSIVE METABOLIC PANEL
ALT: 35 IU/L (ref 0–44)
AST: 24 IU/L (ref 0–40)
Albumin/Globulin Ratio: 1.8 (ref 1.2–2.2)
Albumin: 4.6 g/dL (ref 4.0–5.0)
Alkaline Phosphatase: 49 IU/L (ref 44–121)
BUN/Creatinine Ratio: 15 (ref 9–20)
BUN: 14 mg/dL (ref 6–24)
Bilirubin Total: 0.6 mg/dL (ref 0.0–1.2)
CO2: 21 mmol/L (ref 20–29)
Calcium: 9.9 mg/dL (ref 8.7–10.2)
Chloride: 101 mmol/L (ref 96–106)
Creatinine, Ser: 0.96 mg/dL (ref 0.76–1.27)
Globulin, Total: 2.5 g/dL (ref 1.5–4.5)
Glucose: 87 mg/dL (ref 65–99)
Potassium: 4.6 mmol/L (ref 3.5–5.2)
Sodium: 138 mmol/L (ref 134–144)
Total Protein: 7.1 g/dL (ref 6.0–8.5)
eGFR: 98 mL/min/{1.73_m2} (ref >59)

## 2020-09-16 LAB — HEMOGLOBIN A1C
Est. average glucose Bld gHb Est-mCnc: 117 mg/dL
Hgb A1c MFr Bld: 5.7 % — ABNORMAL HIGH (ref 4.8–5.6)

## 2020-09-16 LAB — LIPID PANEL
Chol/HDL Ratio: 4.4 ratio (ref 0.0–5.0)
Cholesterol, Total: 191 mg/dL (ref 100–199)
HDL: 43 mg/dL (ref >39)
LDL Chol Calc (NIH): 132 mg/dL — ABNORMAL HIGH (ref 0–99)
Triglycerides: 87 mg/dL (ref 0–149)
VLDL Cholesterol Cal: 16 mg/dL (ref 5–40)

## 2020-09-16 LAB — VITAMIN D 25 HYDROXY (VIT D DEFICIENCY, FRACTURES): Vit D, 25-Hydroxy: 25.3 ng/mL — ABNORMAL LOW (ref 30.0–100.0)

## 2020-09-16 LAB — VITAMIN B12: Vitamin B-12: 277 pg/mL (ref 232–1245)

## 2020-09-16 LAB — INSULIN, RANDOM: INSULIN: 14.2 u[IU]/mL (ref 2.6–24.9)

## 2020-09-22 NOTE — Progress Notes (Signed)
Chief Complaint:   OBESITY Brad Thompson is here to discuss his progress with his obesity treatment plan along with follow-up of his obesity related diagnoses.   Today's visit was #: 16 Starting weight: 279 lbs Starting date: 09/03/2019 Today's weight: 262 lbs Today's date: 09/15/2020 Total lbs lost to date: 17 lbs Body mass index is 36.54 kg/m.  Total weight loss percentage to date: -6.09%  Interim History:[name] says he is happy that he has been about to maintain but wants to get back into the weight loss mindset. His muscle mass has increased since last visit. A new IC was performed today and his new RMR is 2817.  Current Meal Plan: the Category 3 Plan for 80% of the time.  Current Exercise Plan: Increased yardwork.  Assessment/Plan:   1. Insulin resistance Goal is HgbA1c < 5.7, fasting insulin closer to 5.  Medication: None.    Plan:  He will continue to focus on protein-rich, low simple carbohydrate foods. We reviewed the importance of hydration, regular exercise for stress reduction, and restorative sleep. We will check labs today.  Lab Results  Component Value Date   HGBA1C 5.3 09/03/2019   Lab Results  Component Value Date   INSULIN 28.3 (H) 12/25/2019   INSULIN 28.9 (H) 09/03/2019   - Comprehensive metabolic panel - Hemoglobin A1c - Insulin, random  2. Vitamin D deficiency Improving, but not optimized. Current vitamin D is 50.4, tested on 12/25/2019. Optimal goal > 50 ng/dL.   Plan: Continue to take prescription Vitamin D @50 ,000 IU every week as prescribed.  Follow-up for routine testing of Vitamin D, at least 2-3 times per year to avoid over-replacement. We will check labs today.  - VITAMIN D 25 Hydroxy (Vit-D Deficiency, Fractures)  3. B12 deficiency Lab Results  Component Value Date   VITAMINB12 317 12/25/2019   Supplementation: daily multivitamin. Continue current treatment and we will check labs today.  - Vitamin B12  4. Pure  hypercholesterolemia Course: Not at goal. Lipid-lowering medications: none.   Plan: Dietary changes: Increase soluble fiber, decrease simple carbohydrates, decrease saturated fat. Exercise changes: Moderate to vigorous-intensity aerobic activity 150 minutes per week or as tolerated. We will continue to monitor along with PCP/specialists as it pertains to his weight loss journey. We will check labs today.  Lab Results  Component Value Date   CHOL 181 12/25/2019   HDL 41 12/25/2019   LDLCALC 124 (H) 12/25/2019   TRIG 86 12/25/2019   CHOLHDL 3.0 12/25/2019   Lab Results  Component Value Date   ALT 31 12/25/2019   AST 26 12/25/2019   ALKPHOS 55 12/25/2019   BILITOT 0.6 12/25/2019   The 10-year ASCVD risk score 12/27/2019 DC Jr., et al., 2013) is: 5.4%   Values used to calculate the score:     Age: 50 years     Sex: Male     Is Non-Hispanic African American: Yes     Diabetic: No     Tobacco smoker: No     Systolic Blood Pressure: 138 mmHg     Is BP treated: No     HDL Cholesterol: 43 mg/dL     Total Cholesterol: 191 mg/dL  - Lipid panel  5. SOB (shortness of breath) on exertion Repeat IC done today. New RMR 2817.  6. Other depression Medication: None.  Plan:  Behavior modification techniques were discussed today to help deal with emotional/non-hunger eating behaviors.  7. At risk for heart disease Due to Arling's current state of  health and medical condition(s), he is at a higher risk for heart disease.  This puts the patient at much greater risk to subsequently develop cardiopulmonary conditions that can significantly affect patient's quality of life in a negative manner.    At least 15 minutes were spent on counseling Zaidin about these concerns today. Evidence-based interventions for health behavior change were utilized today including the discussion of self monitoring techniques, problem-solving barriers, and SMART goal setting techniques.  Specifically, regarding patient's less  desirable eating habits and patterns, we employed the technique of small changes when [name] has not been able to fully commit to his prudent nutritional plan.  8. Obesity, current BMI 36.6  Course: Orpheus is currently in the action stage of change. As such, his goal is to continue with weight loss efforts.   Nutrition goals: He has agreed to the Category 3 Plan.   Exercise goals: For substantial health benefits, adults should do at least 150 minutes (2 hours and 30 minutes) a week of moderate-intensity, or 75 minutes (1 hour and 15 minutes) a week of vigorous-intensity aerobic physical activity, or an equivalent combination of moderate- and vigorous-intensity aerobic activity. Aerobic activity should be performed in episodes of at least 10 minutes, and preferably, it should be spread throughout the week.  Behavioral modification strategies: increasing lean protein intake, decreasing simple carbohydrates, increasing vegetables and increasing water intake.  [name] has agreed to follow-up with our clinic in 4 weeks. He was informed of the importance of frequent follow-up visits to maximize his success with intensive lifestyle modifications for his multiple health conditions.   [name] was informed we would discuss his lab results at his next visit unless there is a critical issue that needs to be addressed sooner. Foster agreed to keep his next visit at the agreed upon time to discuss these results.  Objective:   Blood pressure 138/77, pulse 82, temperature 98.9 F (37.2 C), temperature source Oral, height 5\' 11"  (1.803 m), weight 262 lb (118.8 kg), SpO2 96 %. Body mass index is 36.54 kg/m.  General: Cooperative, alert, well developed, in no acute distress. HEENT: Conjunctivae and lids unremarkable. Cardiovascular: Regular rhythm.  Lungs: Normal work of breathing. Neurologic: No focal deficits.   Lab Results  Component Value Date   HGBA1C 5.3 09/03/2019   Lab Results  Component Value Date    INSULIN 28.3 (H) 12/25/2019   INSULIN 28.9 (H) 09/03/2019   Lab Results  Component Value Date   TSH 2.200 09/03/2019   Lab Results  Component Value Date   WBC 15.2 (H) 06/07/2020   HGB 16.2 06/07/2020   HCT 43.9 06/07/2020   MCV 80.4 06/07/2020   PLT 163 06/07/2020    Attestation Statements:   Reviewed by clinician on day of visit: allergies, medications, problem list, medical history, surgical history, family history, social history, and previous encounter notes.  06/09/2020 [place], CMA, am acting as 11-[birth year]-1972 for Energy manager, DO.  I have reviewed the above documentation for accuracy and completeness, and I agree with the above. [employer], DO

## 2020-10-21 ENCOUNTER — Ambulatory Visit (INDEPENDENT_AMBULATORY_CARE_PROVIDER_SITE_OTHER): Payer: BC Managed Care – PPO | Admitting: Family Medicine

## 2020-10-21 ENCOUNTER — Encounter (INDEPENDENT_AMBULATORY_CARE_PROVIDER_SITE_OTHER): Payer: Self-pay | Admitting: Family Medicine

## 2020-10-21 ENCOUNTER — Other Ambulatory Visit: Payer: Self-pay

## 2020-10-21 VITALS — BP 116/70 | HR 72 | Temp 97.9°F | Ht 71.0 in | Wt 260.0 lb

## 2020-10-21 DIAGNOSIS — R7303 Prediabetes: Secondary | ICD-10-CM | POA: Diagnosis not present

## 2020-10-21 DIAGNOSIS — Z9189 Other specified personal risk factors, not elsewhere classified: Secondary | ICD-10-CM | POA: Diagnosis not present

## 2020-10-21 DIAGNOSIS — Z6838 Body mass index (BMI) 38.0-38.9, adult: Secondary | ICD-10-CM

## 2020-10-21 DIAGNOSIS — E559 Vitamin D deficiency, unspecified: Secondary | ICD-10-CM | POA: Diagnosis not present

## 2020-10-21 DIAGNOSIS — E7800 Pure hypercholesterolemia, unspecified: Secondary | ICD-10-CM

## 2020-10-21 DIAGNOSIS — E538 Deficiency of other specified B group vitamins: Secondary | ICD-10-CM

## 2020-10-28 NOTE — Progress Notes (Signed)
Chief Complaint:   OBESITY Brad Thompson is here to discuss his progress with his obesity treatment plan along with follow-up of his obesity related diagnoses.   Today's visit was #: 17 Starting weight: 279 lbs Starting date: 09/03/2019 Today's weight: 260 lbs Today's date: 10/21/2020 Weight change since last visit: 2 lbs Total lbs lost to date: 19 lbs Body mass index is 36.26 kg/m.  Total weight loss percentage to date: -6.81%  Interim History:  [name] says he is doing well.  He is exercise more often but admits that he needs to start walking at work again. Current Meal Plan: the Category 3 Plan for 85% of the time.  Current Exercise Plan: Walking and mowing for 30-40 minutes 5-6 times per week.  Assessment/Plan:   1. B12 deficiency  Lab Results  Component Value Date   VITAMINB12 277 09/15/2020   Supplementation: None.Plan:  Discussed labs with patient today.  Start OTC B complex vitamin.  2. Vitamin D deficiency Not at goal. Current vitamin D is 25.3, tested on 09/15/2020. Optimal goal > 50 ng/dL.  Brad Thompson is taking a multivitamin.  Plan:  Discussed labs with patient today.  Start OTC vitamin D 2,000 IU daily.  Follow-up for routine testing of Vitamin D, at least 2-3 times per year to avoid over-replacement.  3. Prediabetes At goal. Goal is HgbA1c < 5.7.  Medication: None.  A1c is 5.7.  Plan:  Discussed labs with patient today.  He will continue to focus on protein-rich, low simple carbohydrate foods. We reviewed the importance of hydration, regular exercise for stress reduction, and restorative sleep.   Lab Results  Component Value Date   HGBA1C 5.7 (H) 09/15/2020   Lab Results  Component Value Date   INSULIN 14.2 09/15/2020   INSULIN 28.3 (H) 12/25/2019   INSULIN 28.9 (H) 09/03/2019   4. Pure hypercholesterolemia Course: Not optimized. Lipid-lowering medications: None.   Plan:  Discussed labs with patient today.  Dietary changes: Increase soluble fiber, decrease simple  carbohydrates, decrease saturated fat. Exercise changes: Moderate to vigorous-intensity aerobic activity 150 minutes per week or as tolerated. We will continue to monitor along with PCP/specialists as it pertains to his weight loss journey.  Lab Results  Component Value Date   CHOL 191 09/15/2020   HDL 43 09/15/2020   LDLCALC 132 (H) 09/15/2020   TRIG 87 09/15/2020   CHOLHDL 4.4 09/15/2020   Lab Results  Component Value Date   ALT 35 09/15/2020   AST 24 09/15/2020   ALKPHOS 49 09/15/2020   BILITOT 0.6 09/15/2020   The 10-year ASCVD risk score Denman George DC Jr., et al., 2013) is: 4%   Values used to calculate the score:     Age: 64 years     Sex: Male     Is Non-Hispanic African American: Yes     Diabetic: No     Tobacco smoker: No     Systolic Blood Pressure: 116 mmHg     Is BP treated: No     HDL Cholesterol: 43 mg/dL     Total Cholesterol: 191 mg/dL  5. At risk for heart disease Due to Brad Thompson's current state of health and medical condition(s), he is at a higher risk for heart disease.  This puts the patient at much greater risk to subsequently develop cardiopulmonary conditions that can significantly affect patient's quality of life in a negative manner.    At least 8 minutes were spent on counseling Cameron about these concerns today. Evidence-based interventions  for health behavior change were utilized today including the discussion of self monitoring techniques, problem-solving barriers, and SMART goal setting techniques.  Specifically, regarding patient's less desirable eating habits and patterns, we employed the technique of small changes when Brad Thompson has not been able to fully commit to his prudent nutritional plan.  6. Obesity, current BMI 36.3  Course: [name] is currently in the action stage of change. As such, his goal is to continue with weight loss efforts.   Nutrition goals: He has agreed to the Category 4 Plan.   Exercise goals: For substantial health benefits, adults should  do at least 150 minutes (2 hours and 30 minutes) a week of moderate-intensity, or 75 minutes (1 hour and 15 minutes) a week of vigorous-intensity aerobic physical activity, or an equivalent combination of moderate- and vigorous-intensity aerobic activity. Aerobic activity should be performed in episodes of at least 10 minutes, and preferably, it should be spread throughout the week.  Behavioral modification strategies: increasing lean protein intake, decreasing simple carbohydrates, increasing vegetables and increasing water intake.  Brad Thompson has agreed to follow-up with our clinic in 4 weeks. He was informed of the importance of frequent follow-up visits to maximize his success with intensive lifestyle modifications for his multiple health conditions.   Objective:   Blood pressure 116/70, pulse 72, temperature 97.9 F (36.6 C), height 5\' 11"  (1.803 m), weight 260 lb (117.9 kg), SpO2 97 %. Body mass index is 36.26 kg/m.  General: Cooperative, alert, well developed, in no acute distress. HEENT: Conjunctivae and lids unremarkable. Cardiovascular: Regular rhythm.  Lungs: Normal work of breathing. Neurologic: No focal deficits.   Lab Results  Component Value Date   CREATININE 0.96 09/15/2020   BUN 14 09/15/2020   NA 138 09/15/2020   K 4.6 09/15/2020   CL 101 09/15/2020   CO2 21 09/15/2020   Lab Results  Component Value Date   ALT 35 09/15/2020   AST 24 09/15/2020   ALKPHOS 49 09/15/2020   BILITOT 0.6 09/15/2020   Lab Results  Component Value Date   HGBA1C 5.7 (H) 09/15/2020   HGBA1C 5.3 09/03/2019   Lab Results  Component Value Date   INSULIN 14.2 09/15/2020   INSULIN 28.3 (H) 12/25/2019   INSULIN 28.9 (H) 09/03/2019   Lab Results  Component Value Date   TSH 2.200 09/03/2019   Lab Results  Component Value Date   CHOL 191 09/15/2020   HDL 43 09/15/2020   LDLCALC 132 (H) 09/15/2020   TRIG 87 09/15/2020   CHOLHDL 4.4 09/15/2020   Lab Results  Component Value Date    WBC 15.2 (H) 06/07/2020   HGB 16.2 06/07/2020   HCT 43.9 06/07/2020   MCV 80.4 06/07/2020   PLT 163 06/07/2020   Attestation Statements:   Reviewed by clinician on day of visit: allergies, medications, problem list, medical history, surgical history, family history, social history, and previous encounter notes.  I, 06/09/2020, CMA, am acting as transcriptionist for Insurance claims handler, DO  I have reviewed the above documentation for accuracy and completeness, and I agree with the above. Helane Rima, DO

## 2020-11-24 ENCOUNTER — Other Ambulatory Visit: Payer: Self-pay

## 2020-11-24 ENCOUNTER — Encounter (INDEPENDENT_AMBULATORY_CARE_PROVIDER_SITE_OTHER): Payer: Self-pay | Admitting: Family Medicine

## 2020-11-24 ENCOUNTER — Ambulatory Visit (INDEPENDENT_AMBULATORY_CARE_PROVIDER_SITE_OTHER): Payer: BC Managed Care – PPO | Admitting: Family Medicine

## 2020-11-24 VITALS — BP 125/75 | HR 67 | Temp 98.1°F | Ht 71.0 in | Wt 262.0 lb

## 2020-11-24 DIAGNOSIS — E538 Deficiency of other specified B group vitamins: Secondary | ICD-10-CM | POA: Diagnosis not present

## 2020-11-24 DIAGNOSIS — Z6838 Body mass index (BMI) 38.0-38.9, adult: Secondary | ICD-10-CM

## 2020-11-24 DIAGNOSIS — E559 Vitamin D deficiency, unspecified: Secondary | ICD-10-CM

## 2020-11-24 DIAGNOSIS — Z9189 Other specified personal risk factors, not elsewhere classified: Secondary | ICD-10-CM

## 2020-11-24 DIAGNOSIS — R7303 Prediabetes: Secondary | ICD-10-CM | POA: Diagnosis not present

## 2020-12-01 NOTE — Progress Notes (Signed)
Chief Complaint:   OBESITY Brad Thompson is here to discuss his progress with his obesity treatment plan along with follow-up of his obesity related diagnoses.   Today's visit was #: 18 Starting weight: 279 lbs Starting date: 09/03/2019 Today's weight: 262 lbs Today's date: 11/24/2020 Weight change since last visit: +2 lbs Total lbs lost to date: 17 lbs Body mass index is 36.54 kg/m.  Total weight loss percentage to date: -6.09%  Interim History:  Stable.  Unable to exercise as much due to his children having sports. Current Meal Plan: the Category 4 Plan for 80% of the time.  Current Exercise Plan: Walking for 40 minutes 1-3 times per week.  Assessment/Plan:   1. B12 deficiency  Lab Results  Component Value Date   VITAMINB12 277 09/15/2020   Supplementation: Daily OTC supplement.   Plan:  Discussed labs with patient today.  Continue daily OTC B12 supplement.    2. Prediabetes At goal. Goal is HgbA1c < 5.7.  Medication: None.    Plan:  Discussed labs with patient today.  We discussed GLP-1RA.  He will continue to focus on protein-rich, low simple carbohydrate foods. We reviewed the importance of hydration, regular exercise for stress reduction, and restorative sleep.   Lab Results  Component Value Date   HGBA1C 5.7 (H) 09/15/2020   Lab Results  Component Value Date   INSULIN 14.2 09/15/2020   INSULIN 28.3 (H) 12/25/2019   INSULIN 28.9 (H) 09/03/2019   3. Vitamin D deficiency Not at goal. Current vitamin D is 25.3, tested on 09/15/2020. Optimal goal > 50 ng/dL.  He is taking an OTC vitamin D supplement.  Plan:  Discussed labs with patient today.  Continue current OTC vitamin D supplementation.  Follow-up for routine testing of Vitamin D, at least 2-3 times per year to avoid over-replacement.  4. At risk for heart disease Due to Brad Thompson's current state of health and medical condition(s), he is at a higher risk for heart disease.  This puts the patient at much greater risk  to subsequently develop cardiopulmonary conditions that can significantly affect patient's quality of life in a negative manner.    At least 8 minutes were spent on counseling Brad Thompson about these concerns today. Evidence-based interventions for health behavior change were utilized today including the discussion of self monitoring techniques, problem-solving barriers, and SMART goal setting techniques.  Specifically, regarding patient's less desirable eating habits and patterns, we employed the technique of small changes when Brad Thompson has not been able to fully commit to his prudent nutritional plan.  5. Obesity, current BMI 36.6  Course: Brad Thompson is currently in the action stage of change. As such, his goal is to continue with weight loss efforts.   Nutrition goals: He has agreed to the Category 4 Plan.   Exercise goals: For substantial health benefits, adults should do at least 150 minutes (2 hours and 30 minutes) a week of moderate-intensity, or 75 minutes (1 hour and 15 minutes) a week of vigorous-intensity aerobic physical activity, or an equivalent combination of moderate- and vigorous-intensity aerobic activity. Aerobic activity should be performed in episodes of at least 10 minutes, and preferably, it should be spread throughout the week.  Behavioral modification strategies: increasing lean protein intake, decreasing simple carbohydrates, and increasing vegetables.  Brad Thompson has agreed to follow-up with our clinic in 4 weeks. He was informed of the importance of frequent follow-up visits to maximize his success with intensive lifestyle modifications for his multiple health conditions.   Objective:  Blood pressure 125/75, pulse 67, temperature 98.1 F (36.7 C), temperature source Oral, height 5\' 11"  (1.803 m), weight 262 lb (118.8 kg), SpO2 97 %. Body mass index is 36.54 kg/m.  General: Cooperative, alert, well developed, in no acute distress. HEENT: Conjunctivae and lids  unremarkable. Cardiovascular: Regular rhythm.  Lungs: Normal work of breathing. Neurologic: No focal deficits.   Lab Results  Component Value Date   CREATININE 0.96 09/15/2020   BUN 14 09/15/2020   NA 138 09/15/2020   K 4.6 09/15/2020   CL 101 09/15/2020   CO2 21 09/15/2020   Lab Results  Component Value Date   ALT 35 09/15/2020   AST 24 09/15/2020   ALKPHOS 49 09/15/2020   BILITOT 0.6 09/15/2020   Lab Results  Component Value Date   HGBA1C 5.7 (H) 09/15/2020   HGBA1C 5.3 09/03/2019   Lab Results  Component Value Date   INSULIN 14.2 09/15/2020   INSULIN 28.3 (H) 12/25/2019   INSULIN 28.9 (H) 09/03/2019   Lab Results  Component Value Date   TSH 2.200 09/03/2019   Lab Results  Component Value Date   CHOL 191 09/15/2020   HDL 43 09/15/2020   LDLCALC 132 (H) 09/15/2020   TRIG 87 09/15/2020   CHOLHDL 4.4 09/15/2020   Lab Results  Component Value Date   WBC 15.2 (H) 06/07/2020   HGB 16.2 06/07/2020   HCT 43.9 06/07/2020   MCV 80.4 06/07/2020   PLT 163 06/07/2020   Attestation Statements:   Reviewed by clinician on day of visit: allergies, medications, problem list, medical history, surgical history, family history, social history, and previous encounter notes.  I, 06/09/2020, CMA, am acting as transcriptionist for Insurance claims handler, DO  I have reviewed the above documentation for accuracy and completeness, and I agree with the above. Helane Rima, DO

## 2020-12-30 ENCOUNTER — Other Ambulatory Visit: Payer: Self-pay

## 2020-12-30 ENCOUNTER — Ambulatory Visit (INDEPENDENT_AMBULATORY_CARE_PROVIDER_SITE_OTHER): Payer: BC Managed Care – PPO | Admitting: Family Medicine

## 2020-12-30 ENCOUNTER — Encounter (INDEPENDENT_AMBULATORY_CARE_PROVIDER_SITE_OTHER): Payer: Self-pay | Admitting: Family Medicine

## 2020-12-30 VITALS — BP 123/75 | HR 64 | Temp 98.4°F | Ht 71.0 in | Wt 263.0 lb

## 2020-12-30 DIAGNOSIS — Z6838 Body mass index (BMI) 38.0-38.9, adult: Secondary | ICD-10-CM

## 2020-12-30 DIAGNOSIS — E7800 Pure hypercholesterolemia, unspecified: Secondary | ICD-10-CM | POA: Diagnosis not present

## 2020-12-30 DIAGNOSIS — Z9189 Other specified personal risk factors, not elsewhere classified: Secondary | ICD-10-CM | POA: Diagnosis not present

## 2020-12-30 DIAGNOSIS — F3289 Other specified depressive episodes: Secondary | ICD-10-CM

## 2020-12-30 DIAGNOSIS — R7303 Prediabetes: Secondary | ICD-10-CM | POA: Diagnosis not present

## 2021-01-09 NOTE — Progress Notes (Signed)
Chief Complaint:   OBESITY Brad Thompson is here to discuss his progress with his obesity treatment plan along with follow-up of his obesity related diagnoses.   Today's visit was #: 19 Starting weight: 279 lbs Starting date: 09/03/2019 Today's weight: 263 lbs Today's date: 12/30/2020 Weight change since last visit: +1 lb Total lbs lost to date: 16 lbs Body mass index is 36.68 kg/m.  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  He had gained 3-4 pounds, but is now losing again.    Current Meal Plan: the Category 4 Plan for 70-80% of the time.  Current Exercise Plan: Increased walking/activity.  Assessment/Plan:   1. Prediabetes At goal. Goal is HgbA1c < 5.7.  Medication: None.    Plan:  He will continue to focus on protein-rich, low simple carbohydrate foods. We reviewed the importance of hydration, regular exercise for stress reduction, and restorative sleep.   Lab Results  Component Value Date   HGBA1C 5.7 (H) 09/15/2020   Lab Results  Component Value Date   INSULIN 14.2 09/15/2020   INSULIN 28.3 (H) 12/25/2019   INSULIN 28.9 (H) 09/03/2019   2. Pure hypercholesterolemia Course: Not optimized. Lipid-lowering medications: None.   Plan: Dietary changes: Increase soluble fiber, decrease simple carbohydrates, decrease saturated fat. Exercise changes: Moderate to vigorous-intensity aerobic activity 150 minutes per week or as tolerated. We will continue to monitor along with PCP/specialists as it pertains to his weight loss journey.  Lab Results  Component Value Date   CHOL 191 09/15/2020   HDL 43 09/15/2020   LDLCALC 132 (H) 09/15/2020   TRIG 87 09/15/2020   CHOLHDL 4.4 09/15/2020   Lab Results  Component Value Date   ALT 35 09/15/2020   AST 24 09/15/2020   ALKPHOS 49 09/15/2020   BILITOT 0.6 09/15/2020   The 10-year ASCVD risk score Denman Thompson DC Jr., et al.,  2013) is: 4.4%   Values used to calculate the score:     Age: 50 years     Sex: Male     Is Non-Hispanic African American: Yes     Diabetic: No     Tobacco smoker: No     Systolic Blood Pressure: 123 mmHg     Is BP treated: No     HDL Cholesterol: 43 mg/dL     Total Cholesterol: 191 mg/dL  3. Emotional eating tendencies Not optimized. Medication: None.  Plan:  Discussed cues and consequences, how thoughts affect eating, model of thoughts, feelings, and behaviors, and strategies for change by focusing on the cue. Discussed cognitive distortions, coping thoughts, and how to change your thoughts. Behavior modification techniques were discussed today to help deal with emotional/non-hunger eating behaviors.  4. At risk for heart disease Due to Brad's current state of health and medical condition(s), he is at a higher risk for heart disease.   This puts the patient at much greater risk to subsequently develop cardiopulmonary conditions that can significantly affect patient's quality of life in a negative manner as well.    At least 9 minutes was spent on counseling Brad Thompson about these concerns today. Initial goal is to lose at least 5-10% of starting weight to help reduce these risk factors.  We will continue to reassess these conditions on a fairly regular basis in an attempt to decrease patient's overall morbidity and mortality.  Evidence-based interventions for health behavior change were utilized  today including the discussion of self monitoring techniques, problem-solving barriers and SMART goal setting techniques.  Specifically regarding patient's less desirable eating habits and patterns, we employed the technique of small changes when Brad Thompson has not been able to fully commit to his prudent nutritional plan.  5. Obesity, current BMI 36.7  Course: [name] is currently in the action stage of change. As such, his goal is to continue with weight loss efforts.   Nutrition goals: He has agreed to the  Category 4 Plan.  Goal is 90%.  Exercise goals:  Walking 3-4 days per week.  Behavioral modification strategies: increasing lean protein intake, decreasing simple carbohydrates, increasing vegetables, increasing water intake, better snacking choices, and emotional eating strategies.  Tamari has agreed to follow-up with our clinic in 4 weeks. He was informed of the importance of frequent follow-up visits to maximize his success with intensive lifestyle modifications for his multiple health conditions.   Objective:   Blood pressure 123/75, pulse 64, temperature 98.4 F (36.9 C), temperature source Oral, height 5\' 11"  (1.803 m), weight 263 lb (119.3 kg), SpO2 97 %. Body mass index is 36.68 kg/m.  General: Cooperative, alert, well developed, in no acute distress. HEENT: Conjunctivae and lids unremarkable. Cardiovascular: Regular rhythm.  Lungs: Normal work of breathing. Neurologic: No focal deficits.   Lab Results  Component Value Date   CREATININE 0.96 09/15/2020   BUN 14 09/15/2020   NA 138 09/15/2020   K 4.6 09/15/2020   CL 101 09/15/2020   CO2 21 09/15/2020   Lab Results  Component Value Date   ALT 35 09/15/2020   AST 24 09/15/2020   ALKPHOS 49 09/15/2020   BILITOT 0.6 09/15/2020   Lab Results  Component Value Date   HGBA1C 5.7 (H) 09/15/2020   HGBA1C 5.3 09/03/2019   Lab Results  Component Value Date   INSULIN 14.2 09/15/2020   INSULIN 28.3 (H) 12/25/2019   INSULIN 28.9 (H) 09/03/2019   Lab Results  Component Value Date   TSH 2.200 09/03/2019   Lab Results  Component Value Date   CHOL 191 09/15/2020   HDL 43 09/15/2020   LDLCALC 132 (H) 09/15/2020   TRIG 87 09/15/2020   CHOLHDL 4.4 09/15/2020   Lab Results  Component Value Date   VD25OH 25.3 (L) 09/15/2020   VD25OH 50.4 12/25/2019   VD25OH 23.2 (L) 09/03/2019   Lab Results  Component Value Date   WBC 15.2 (H) 06/07/2020   HGB 16.2 06/07/2020   HCT 43.9 06/07/2020   MCV 80.4 06/07/2020   PLT 163  06/07/2020   Attestation Statements:   Reviewed by clinician on day of visit: allergies, medications, problem list, medical history, surgical history, family history, social history, and previous encounter notes.  I, 06/09/2020, CMA, am acting as transcriptionist for Insurance claims handler, DO  I have reviewed the above documentation for accuracy and completeness, and I agree with the above. Helane Rima, DO

## 2021-01-28 ENCOUNTER — Encounter (INDEPENDENT_AMBULATORY_CARE_PROVIDER_SITE_OTHER): Payer: Self-pay | Admitting: Family Medicine

## 2021-01-28 ENCOUNTER — Other Ambulatory Visit: Payer: Self-pay

## 2021-01-28 ENCOUNTER — Ambulatory Visit (INDEPENDENT_AMBULATORY_CARE_PROVIDER_SITE_OTHER): Payer: BC Managed Care – PPO | Admitting: Family Medicine

## 2021-01-28 VITALS — BP 123/78 | HR 75 | Temp 98.5°F | Ht 71.0 in | Wt 258.0 lb

## 2021-01-28 DIAGNOSIS — Z6838 Body mass index (BMI) 38.0-38.9, adult: Secondary | ICD-10-CM

## 2021-01-28 DIAGNOSIS — R7303 Prediabetes: Secondary | ICD-10-CM

## 2021-01-28 DIAGNOSIS — E65 Localized adiposity: Secondary | ICD-10-CM

## 2021-01-28 DIAGNOSIS — Z9189 Other specified personal risk factors, not elsewhere classified: Secondary | ICD-10-CM | POA: Diagnosis not present

## 2021-01-28 DIAGNOSIS — E7800 Pure hypercholesterolemia, unspecified: Secondary | ICD-10-CM

## 2021-02-02 NOTE — Progress Notes (Signed)
Chief Complaint:   OBESITY Brad Thompson is here to discuss his progress with his obesity treatment plan along with follow-up of his obesity related diagnoses.   Today's visit was #: 20 Starting weight: 279 lbs Starting date: 09/03/2019 Today's weight: 258 lbs Today's date: 01/28/2021 Weight change since last visit: 5 lbs Total lbs lost to date: 21 lbs Body mass index is 35.98 kg/m.  Total weight loss percentage to date: -7.53%  Current Meal Plan: the Category 3 Plan for 70-80% of the time.  Current Exercise Plan: Walking 1.5-2 miles 2-3 times per week.  Interim History:  Brad Thompson says his job changed slightly and he has been much busier.  He has been walking with his wife at night.  Assessment/Plan:   1. Prediabetes At goal. Goal is HgbA1c < 5.7.  Medication: None.    Plan:  He will continue to focus on protein-rich, low simple carbohydrate foods. We reviewed the importance of hydration, regular exercise for stress reduction, and restorative sleep.   Lab Results  Component Value Date   HGBA1C 5.7 (H) 09/15/2020   Lab Results  Component Value Date   INSULIN 14.2 09/15/2020   INSULIN 28.3 (H) 12/25/2019   INSULIN 28.9 (H) 09/03/2019   2. Pure hypercholesterolemia Course: Improving, but not optimized. Lipid-lowering medications: None.   Plan: Dietary changes: Increase soluble fiber, decrease simple carbohydrates, decrease saturated fat. Exercise changes: Moderate to vigorous-intensity aerobic activity 150 minutes per week or as tolerated. We will continue to monitor along with PCP/specialists as it pertains to his weight loss journey.  Lab Results  Component Value Date   CHOL 191 09/15/2020   HDL 43 09/15/2020   LDLCALC 132 (H) 09/15/2020   TRIG 87 09/15/2020   CHOLHDL 4.4 09/15/2020   Lab Results  Component Value Date   ALT 35 09/15/2020   AST 24 09/15/2020   ALKPHOS 49 09/15/2020   BILITOT 0.6 09/15/2020   The 10-year ASCVD risk score Denman George DC Jr., et al., 2013) is:  4.4%   Values used to calculate the score:     Age: 50 years     Sex: Male     Is Non-Hispanic African American: Yes     Diabetic: No     Tobacco smoker: No     Systolic Blood Pressure: 123 mmHg     Is BP treated: No     HDL Cholesterol: 43 mg/dL     Total Cholesterol: 191 mg/dL  3. Visceral obesity Current visceral fat rating: 16. Visceral fat rating should be < 13. Visceral adipose tissue is a hormonally active component of total body fat. This body composition phenotype is associated with medical disorders such as metabolic syndrome, cardiovascular disease and several malignancies including prostate, breast, and colorectal cancers. Starting goal: Lose 7-10% of starting weight.   4. At risk for heart disease Due to Brad Thompson's current state of health and medical condition(s), he is at a higher risk for heart disease.  This puts the patient at much greater risk to subsequently develop cardiopulmonary conditions that can significantly affect patient's quality of life in a negative manner.    At least 8 minutes were spent on counseling Brad Thompson about these concerns today. Evidence-based interventions for health behavior change were utilized today including the discussion of self monitoring techniques, problem-solving barriers, and SMART goal setting techniques.  Specifically, regarding patient's less desirable eating habits and patterns, we employed the technique of small changes when Brad Thompson has not been able to fully commit to  his prudent nutritional plan.  5. Obesity, current BMI 36.1  Course: Brad Thompson is currently in the action stage of change. As such, his goal is to continue with weight loss efforts.   Nutrition goals: He has agreed to the Category 3 Plan.   Exercise goals:  As is.  Behavioral modification strategies: increasing lean protein intake, decreasing simple carbohydrates, increasing vegetables, increasing water intake, and decreasing liquid calories.  Brad Thompson has agreed to follow-up with  our clinic in 4 weeks. He was informed of the importance of frequent follow-up visits to maximize his success with intensive lifestyle modifications for his multiple health conditions.   Objective:   Blood pressure 123/78, pulse 75, temperature 98.5 F (36.9 C), temperature source Oral, height 5\' 11"  (1.803 m), weight 258 lb (117 kg), SpO2 97 %. Body mass index is 35.98 kg/m.  General: Cooperative, alert, well developed, in no acute distress. HEENT: Conjunctivae and lids unremarkable. Cardiovascular: Regular rhythm.  Lungs: Normal work of breathing. Neurologic: No focal deficits.   Lab Results  Component Value Date   CREATININE 0.96 09/15/2020   BUN 14 09/15/2020   NA 138 09/15/2020   K 4.6 09/15/2020   CL 101 09/15/2020   CO2 21 09/15/2020   Lab Results  Component Value Date   ALT 35 09/15/2020   AST 24 09/15/2020   ALKPHOS 49 09/15/2020   BILITOT 0.6 09/15/2020   Lab Results  Component Value Date   HGBA1C 5.7 (H) 09/15/2020   HGBA1C 5.3 09/03/2019   Lab Results  Component Value Date   INSULIN 14.2 09/15/2020   INSULIN 28.3 (H) 12/25/2019   INSULIN 28.9 (H) 09/03/2019   Lab Results  Component Value Date   TSH 2.200 09/03/2019   Lab Results  Component Value Date   CHOL 191 09/15/2020   HDL 43 09/15/2020   LDLCALC 132 (H) 09/15/2020   TRIG 87 09/15/2020   CHOLHDL 4.4 09/15/2020   Lab Results  Component Value Date   VD25OH 25.3 (L) 09/15/2020   VD25OH 50.4 12/25/2019   VD25OH 23.2 (L) 09/03/2019   Lab Results  Component Value Date   WBC 15.2 (H) 06/07/2020   HGB 16.2 06/07/2020   HCT 43.9 06/07/2020   MCV 80.4 06/07/2020   PLT 163 06/07/2020   Attestation Statements:   Reviewed by clinician on day of visit: allergies, medications, problem list, medical history, surgical history, family history, social history, and previous encounter notes.  I, 06/09/2020, CMA, am acting as transcriptionist for Insurance claims handler, DO  I have reviewed the above  documentation for accuracy and completeness, and I agree with the above. Helane Rima, DO

## 2021-03-08 ENCOUNTER — Encounter (INDEPENDENT_AMBULATORY_CARE_PROVIDER_SITE_OTHER): Payer: Self-pay | Admitting: Family Medicine

## 2021-03-08 ENCOUNTER — Ambulatory Visit (INDEPENDENT_AMBULATORY_CARE_PROVIDER_SITE_OTHER): Payer: BC Managed Care – PPO | Admitting: Family Medicine

## 2021-03-08 ENCOUNTER — Other Ambulatory Visit (INDEPENDENT_AMBULATORY_CARE_PROVIDER_SITE_OTHER): Payer: Self-pay | Admitting: Family Medicine

## 2021-03-08 ENCOUNTER — Other Ambulatory Visit: Payer: Self-pay

## 2021-03-08 VITALS — BP 139/79 | HR 67 | Temp 98.1°F | Ht 71.0 in | Wt 263.0 lb

## 2021-03-08 DIAGNOSIS — R7303 Prediabetes: Secondary | ICD-10-CM | POA: Diagnosis not present

## 2021-03-08 DIAGNOSIS — Z9189 Other specified personal risk factors, not elsewhere classified: Secondary | ICD-10-CM

## 2021-03-08 DIAGNOSIS — Z6838 Body mass index (BMI) 38.0-38.9, adult: Secondary | ICD-10-CM | POA: Diagnosis not present

## 2021-03-08 MED ORDER — TIRZEPATIDE 2.5 MG/0.5ML ~~LOC~~ SOAJ: 2.5000 mg | SUBCUTANEOUS | 0 refills | Status: DC

## 2021-03-08 NOTE — Telephone Encounter (Signed)
Prior authorization has been started for [employer]. Will notify patient and provider once response received.  

## 2021-03-09 ENCOUNTER — Encounter (INDEPENDENT_AMBULATORY_CARE_PROVIDER_SITE_OTHER): Payer: Self-pay

## 2021-03-09 NOTE — Progress Notes (Signed)
Chief Complaint:   OBESITY Brad Thompson is here to discuss his progress with his obesity treatment plan along with follow-up of his obesity related diagnoses.   Today's visit was #: 21 Starting weight: 279 lbs Starting date: 09/03/2019 Today's weight: 263 lbs Today's date: 03/08/2021 Weight change since last visit: +5 lbs Total lbs lost to date: 16 lbs Body mass index is 36.68 kg/m.  Total weight loss percentage to date: -5.73%  Current Meal Plan: the Category 3 Plan for 60-70% of the time.  Current Exercise Plan: Increased walking/activity.  Interim History:  Brad Thompson endorses increased emotional eating.  Assessment/Plan:   1. Prediabetes, with polyphagia At goal. Goal is HgbA1c < 5.7.  Medication: None.  He also endorses polyphagia with increased emotional eating.  Plan:  Start Mounjaro 2.5 mg subcutaneously weekly.  He will continue to focus on protein-rich, low simple carbohydrate foods. We reviewed the importance of hydration, regular exercise for stress reduction, and restorative sleep.   Lab Results  Component Value Date   HGBA1C 5.7 (H) 09/15/2020   Lab Results  Component Value Date   INSULIN 14.2 09/15/2020   INSULIN 28.3 (H) 12/25/2019   INSULIN 28.9 (H) 09/03/2019   - Start tirzepatide Brad Thompson) 2.5 MG/0.5ML Pen; Inject 2.5 mg into the skin once a week.  Dispense: 2 mL; Refill: 0  2. At risk for nausea Brad Thompson is at risk for nausea due to starting [facility]. We reviewed ways to decrease side effect of nausea through eating small meals, avoiding high sugar or fat foods, and ultimately going back on the dose if not tolerating. Time > 9 minutes.  3. Obesity, current BMI 36.7  Course: Brad Thompson is currently in the action stage of change. As such, his goal is to continue with weight loss efforts.   Nutrition goals: He has agreed to the Category 3 Plan.   Exercise goals:  As is.  Behavioral modification strategies: increasing lean protein intake, decreasing simple  carbohydrates, increasing vegetables, increasing water intake, and decreasing liquid calories.  Brad Thompson has agreed to follow-up with our clinic in 4 weeks. He was informed of the importance of frequent follow-up visits to maximize his success with intensive lifestyle modifications for his multiple health conditions.   Objective:   Blood pressure 139/79, pulse 67, temperature 98.1 F (36.7 C), temperature source Oral, height 5\' 11"  (1.803 m), weight 263 lb (119.3 kg), SpO2 98 %. Body mass index is 36.68 kg/m.  General: Cooperative, alert, well developed, in no acute distress. HEENT: Conjunctivae and lids unremarkable. Cardiovascular: Regular rhythm.  Lungs: Normal work of breathing. Neurologic: No focal deficits.   Lab Results  Component Value Date   CREATININE 0.96 09/15/2020   BUN 14 09/15/2020   NA 138 09/15/2020   K 4.6 09/15/2020   CL 101 09/15/2020   CO2 21 09/15/2020   Lab Results  Component Value Date   ALT 35 09/15/2020   AST 24 09/15/2020   ALKPHOS 49 09/15/2020   BILITOT 0.6 09/15/2020   Lab Results  Component Value Date   HGBA1C 5.7 (H) 09/15/2020   HGBA1C 5.3 09/03/2019   Lab Results  Component Value Date   INSULIN 14.2 09/15/2020   INSULIN 28.3 (H) 12/25/2019   INSULIN 28.9 (H) 09/03/2019   Lab Results  Component Value Date   TSH 2.200 09/03/2019   Lab Results  Component Value Date   CHOL 191 09/15/2020   HDL 43 09/15/2020   LDLCALC 132 (H) 09/15/2020   TRIG 87 09/15/2020  CHOLHDL 4.4 09/15/2020   Lab Results  Component Value Date   VD25OH 25.3 (L) 09/15/2020   VD25OH 50.4 12/25/2019   VD25OH 23.2 (L) 09/03/2019   Lab Results  Component Value Date   WBC 15.2 (H) 06/07/2020   HGB 16.2 06/07/2020   HCT 43.9 06/07/2020   MCV 80.4 06/07/2020   PLT 163 06/07/2020   Attestation Statements:   Reviewed by clinician on day of visit: allergies, medications, problem list, medical history, surgical history, family history, social history, and  previous encounter notes.  I, Brad Thompson, CMA, am acting as transcriptionist for Helane Rima, DO  I have reviewed the above documentation for accuracy and completeness, and I agree with the above. Helane Rima, DO

## 2021-03-09 NOTE — Telephone Encounter (Signed)
Patient sent mychart message, prior authorization denied for [facility]. Saving card information sent to patient.

## 2021-04-07 ENCOUNTER — Encounter (INDEPENDENT_AMBULATORY_CARE_PROVIDER_SITE_OTHER): Payer: Self-pay | Admitting: Family Medicine

## 2021-04-07 ENCOUNTER — Ambulatory Visit (INDEPENDENT_AMBULATORY_CARE_PROVIDER_SITE_OTHER): Payer: BC Managed Care – PPO | Admitting: Family Medicine

## 2021-04-07 ENCOUNTER — Other Ambulatory Visit: Payer: Self-pay

## 2021-04-07 VITALS — BP 137/82 | HR 74 | Temp 98.3°F | Ht 71.0 in | Wt 262.0 lb

## 2021-04-07 DIAGNOSIS — Z6838 Body mass index (BMI) 38.0-38.9, adult: Secondary | ICD-10-CM

## 2021-04-07 DIAGNOSIS — R7303 Prediabetes: Secondary | ICD-10-CM | POA: Diagnosis not present

## 2021-04-12 NOTE — Progress Notes (Signed)
Chief Complaint:   OBESITY Brad Thompson is here to discuss his progress with his obesity treatment plan along with follow-up of his obesity related diagnoses. See Medical Weight Management Flowsheet for complete bioelectrical impedance results.  Today's visit was #: 22 Starting weight: 279 lbs Starting date: 09/03/2019 Weight change since last visit: 1 lb Total lbs lost to date: 17 lbs Total weight loss percentage to date: -6.09%  Nutrition Plan: Category 4 Meal Plan for 60-70% of the time. Activity: None. Anti-obesity medications: Mounjaro 2.5 mg subcutaneously weekly. Reported side effects: None.  Interim History: Brad Thompson took his injection in the office (brought injection into the office for first one).  Went well.  No issues.  Assessment/Plan:   1. Prediabetes, with polyphagia At goal. Goal is HgbA1c < 5.7.  Medication: Mounjaro 2.5 mg subcutaneously weekly.    Plan:  Continue Mounjaro at current dose.  He will continue to focus on protein-rich, low simple carbohydrate foods. We reviewed the importance of hydration, regular exercise for stress reduction, and restorative sleep.   Lab Results  Component Value Date   HGBA1C 5.7 (H) 09/15/2020   Lab Results  Component Value Date   INSULIN 14.2 09/15/2020   INSULIN 28.3 (H) 12/25/2019   INSULIN 28.9 (H) 09/03/2019   Course: Brad Thompson is currently in the action stage of change. As such, his goal is to continue with weight loss efforts.   Nutrition goals: He has agreed to the Category 4 Plan.   Exercise goals: For substantial health benefits, adults should do at least 150 minutes (2 hours and 30 minutes) a week of moderate-intensity, or 75 minutes (1 hour and 15 minutes) a week of vigorous-intensity aerobic physical activity, or an equivalent combination of moderate- and vigorous-intensity aerobic activity. Aerobic activity should be performed in episodes of at least 10 minutes, and preferably, it should be spread throughout the  week.  Behavioral modification strategies: increasing lean protein intake, decreasing simple carbohydrates, and increasing vegetables.  Brad Thompson has agreed to follow-up with our clinic in 4 weeks. He was informed of the importance of frequent follow-up visits to maximize his success with intensive lifestyle modifications for his multiple health conditions.   Objective:   Blood pressure 137/82, pulse 74, temperature 98.3 F (36.8 C), temperature source Oral, height 5\' 11"  (1.803 m), weight 262 lb (118.8 kg), SpO2 98 %. Body mass index is 36.54 kg/m.  General: Cooperative, alert, well developed, in no acute distress. HEENT: Conjunctivae and lids unremarkable. Cardiovascular: Regular rhythm.  Lungs: Normal work of breathing. Neurologic: No focal deficits.   Lab Results  Component Value Date   CREATININE 0.96 09/15/2020   BUN 14 09/15/2020   NA 138 09/15/2020   K 4.6 09/15/2020   CL 101 09/15/2020   CO2 21 09/15/2020   Lab Results  Component Value Date   ALT 35 09/15/2020   AST 24 09/15/2020   ALKPHOS 49 09/15/2020   BILITOT 0.6 09/15/2020   Lab Results  Component Value Date   HGBA1C 5.7 (H) 09/15/2020   HGBA1C 5.3 09/03/2019   Lab Results  Component Value Date   INSULIN 14.2 09/15/2020   INSULIN 28.3 (H) 12/25/2019   INSULIN 28.9 (H) 09/03/2019   Lab Results  Component Value Date   TSH 2.200 09/03/2019   Lab Results  Component Value Date   CHOL 191 09/15/2020   HDL 43 09/15/2020   LDLCALC 132 (H) 09/15/2020   TRIG 87 09/15/2020   CHOLHDL 4.4 09/15/2020   Lab Results  Component Value Date   VD25OH 25.3 (L) 09/15/2020   VD25OH 50.4 12/25/2019   VD25OH 23.2 (L) 09/03/2019   Lab Results  Component Value Date   WBC 15.2 (H) 06/07/2020   HGB 16.2 06/07/2020   HCT 43.9 06/07/2020   MCV 80.4 06/07/2020   PLT 163 06/07/2020   Attestation Statements:   Reviewed by clinician on day of visit: allergies, medications, problem list, medical history, surgical  history, family history, social history, and previous encounter notes.  I, Insurance claims handler, CMA, am acting as transcriptionist for Helane Rima, DO  I have reviewed the above documentation for accuracy and completeness, and I agree with the above. -  Helane Rima, DO, MS, FAAFP, DABOM - Family and Bariatric Medicine.

## 2021-05-12 ENCOUNTER — Ambulatory Visit (INDEPENDENT_AMBULATORY_CARE_PROVIDER_SITE_OTHER): Payer: BC Managed Care – PPO | Admitting: Family Medicine

## 2021-05-12 ENCOUNTER — Encounter (INDEPENDENT_AMBULATORY_CARE_PROVIDER_SITE_OTHER): Payer: Self-pay | Admitting: Family Medicine

## 2021-05-12 ENCOUNTER — Other Ambulatory Visit: Payer: Self-pay

## 2021-05-12 ENCOUNTER — Other Ambulatory Visit (INDEPENDENT_AMBULATORY_CARE_PROVIDER_SITE_OTHER): Payer: Self-pay | Admitting: Family Medicine

## 2021-05-12 VITALS — BP 133/76 | HR 77 | Temp 98.3°F | Ht 71.0 in | Wt 258.0 lb

## 2021-05-12 DIAGNOSIS — E65 Localized adiposity: Secondary | ICD-10-CM

## 2021-05-12 DIAGNOSIS — R7303 Prediabetes: Secondary | ICD-10-CM

## 2021-05-12 DIAGNOSIS — E7800 Pure hypercholesterolemia, unspecified: Secondary | ICD-10-CM | POA: Diagnosis not present

## 2021-05-12 DIAGNOSIS — Z6838 Body mass index (BMI) 38.0-38.9, adult: Secondary | ICD-10-CM

## 2021-05-12 MED ORDER — TIRZEPATIDE 2.5 MG/0.5ML ~~LOC~~ SOAJ: 2.5000 mg | SUBCUTANEOUS | 1 refills | Status: DC

## 2021-05-12 NOTE — Telephone Encounter (Signed)
LOV w/ Wallace

## 2021-05-13 NOTE — Progress Notes (Signed)
Chief Complaint:   OBESITY Brad Thompson is here to discuss his progress with his obesity treatment plan along with follow-up of his obesity related diagnoses. See Medical Weight Management Flowsheet for complete bioelectrical impedance results.  Today's visit was #: 23 Starting weight: 279 lbs Starting date: 09/03/2019 Weight change since last visit: 4 lbs  Total lbs lost to date: 21 lbs Total weight loss percentage to date: -7.53%  Nutrition Plan: Category 4 Plan for 80-90% of the time.  Activity: Increased activity. Anti-obesity medications: Mounjaro 2.5 mg subcutaneously weekly. Reported side effects: None.  Interim History: Brad Thompson is on Mounjaro 2.5 mg subcutaneously weekly.  He says it is helping to decrease polyphagia.  He is tolerating it well.  Assessment/Plan:   1. Prediabetes, with polyphagia Controlled. Goal is HgbA1c < 5.7.  Medication: Mounjaro 2.5 mg subcutaneously weekly.    Plan:  Continue Mounjaro 2.5 mg subcutaneously weekly.  Will refill today, as per below.  He will continue to focus on protein-rich, low simple carbohydrate foods. We reviewed the importance of hydration, regular exercise for stress reduction, and restorative sleep.   Lab Results  Component Value Date   HGBA1C 5.7 (H) 09/15/2020   Lab Results  Component Value Date   INSULIN 14.2 09/15/2020   INSULIN 28.3 (H) 12/25/2019   INSULIN 28.9 (H) 09/03/2019   - Refill tirzepatide (MOUNJARO) 2.5 MG/0.5ML Pen; Inject 2.5 mg into the skin once a week.  Dispense: 2 mL; Refill: 1  2. Pure hypercholesterolemia Course: Not optimized. Lipid-lowering medications: None.   Plan: Dietary changes: Increase soluble fiber, decrease simple carbohydrates, decrease saturated fat. Exercise changes: Moderate to vigorous-intensity aerobic activity 150 minutes per week or as tolerated. We will continue to monitor along with PCP/specialists as it pertains to his weight loss journey.  Lab Results  Component Value Date    CHOL 191 09/15/2020   HDL 43 09/15/2020   LDLCALC 132 (H) 09/15/2020   TRIG 87 09/15/2020   CHOLHDL 4.4 09/15/2020   Lab Results  Component Value Date   ALT 35 09/15/2020   AST 24 09/15/2020   ALKPHOS 49 09/15/2020   BILITOT 0.6 09/15/2020   The 10-year ASCVD risk score (Arnett DK, et al., 2019) is: 5.3%   Values used to calculate the score:     Age: 50 years     Sex: Male     Is Non-Hispanic African American: Yes     Diabetic: No     Tobacco smoker: No     Systolic Blood Pressure: 133 mmHg     Is BP treated: No     HDL Cholesterol: 43 mg/dL     Total Cholesterol: 191 mg/dL  3. Visceral obesity Current visceral fat rating: 17. Visceral fat rating goal is < 13. Visceral adipose tissue is a hormonally active component of total body fat. This body composition phenotype is associated with medical disorders such as metabolic syndrome, cardiovascular disease, and several malignancies including prostate, breast, and colorectal cancers. Starting goal: Lose 7-10% of starting weight.   4. Obesity, current BMI 36  Course: Brad Thompson is currently in the action stage of change. As such, his goal is to continue with weight loss efforts.   Nutrition goals: He has agreed to the Category 4 Plan.   Exercise goals:  As is.  Behavioral modification strategies: increasing lean protein intake, decreasing simple carbohydrates, increasing vegetables, and increasing water intake.  Brad Thompson has agreed to follow-up with our clinic in 4 weeks. He was informed of the  importance of frequent follow-up visits to maximize his success with intensive lifestyle modifications for his multiple health conditions.   Objective:   Blood pressure 133/76, pulse 77, temperature 98.3 F (36.8 C), temperature source Oral, height 5\' 11"  (1.803 m), weight 258 lb (117 kg), SpO2 97 %. Body mass index is 35.98 kg/m.  General: Cooperative, alert, well developed, in no acute distress. HEENT: Conjunctivae and lids  unremarkable. Cardiovascular: Regular rhythm.  Lungs: Normal work of breathing. Neurologic: No focal deficits.   Lab Results  Component Value Date   CREATININE 0.96 09/15/2020   BUN 14 09/15/2020   NA 138 09/15/2020   K 4.6 09/15/2020   CL 101 09/15/2020   CO2 21 09/15/2020   Lab Results  Component Value Date   ALT 35 09/15/2020   AST 24 09/15/2020   ALKPHOS 49 09/15/2020   BILITOT 0.6 09/15/2020   Lab Results  Component Value Date   HGBA1C 5.7 (H) 09/15/2020   HGBA1C 5.3 09/03/2019   Lab Results  Component Value Date   INSULIN 14.2 09/15/2020   INSULIN 28.3 (H) 12/25/2019   INSULIN 28.9 (H) 09/03/2019   Lab Results  Component Value Date   TSH 2.200 09/03/2019   Lab Results  Component Value Date   CHOL 191 09/15/2020   HDL 43 09/15/2020   LDLCALC 132 (H) 09/15/2020   TRIG 87 09/15/2020   CHOLHDL 4.4 09/15/2020   Lab Results  Component Value Date   VD25OH 25.3 (L) 09/15/2020   VD25OH 50.4 12/25/2019   VD25OH 23.2 (L) 09/03/2019   Lab Results  Component Value Date   WBC 15.2 (H) 06/07/2020   HGB 16.2 06/07/2020   HCT 43.9 06/07/2020   MCV 80.4 06/07/2020   PLT 163 06/07/2020   Attestation Statements:   Reviewed by clinician on day of visit: allergies, medications, problem list, medical history, surgical history, family history, social history, and previous encounter notes.  I, 06/09/2020, CMA, am acting as transcriptionist for Insurance claims handler, DO  I have reviewed the above documentation for accuracy and completeness, and I agree with the above. -  Helane Rima, DO, MS, FAAFP, DABOM - Family and Bariatric Medicine.

## 2021-06-22 ENCOUNTER — Encounter (INDEPENDENT_AMBULATORY_CARE_PROVIDER_SITE_OTHER): Payer: Self-pay

## 2021-06-23 ENCOUNTER — Ambulatory Visit (INDEPENDENT_AMBULATORY_CARE_PROVIDER_SITE_OTHER): Payer: BC Managed Care – PPO | Admitting: Family Medicine

## 2021-08-03 ENCOUNTER — Other Ambulatory Visit: Payer: Self-pay

## 2021-08-03 ENCOUNTER — Encounter (INDEPENDENT_AMBULATORY_CARE_PROVIDER_SITE_OTHER): Payer: Self-pay | Admitting: Family Medicine

## 2021-08-03 ENCOUNTER — Ambulatory Visit (INDEPENDENT_AMBULATORY_CARE_PROVIDER_SITE_OTHER): Payer: BC Managed Care – PPO | Admitting: Family Medicine

## 2021-08-03 VITALS — BP 135/78 | HR 68 | Temp 98.5°F | Ht 71.0 in | Wt 264.0 lb

## 2021-08-03 DIAGNOSIS — F3289 Other specified depressive episodes: Secondary | ICD-10-CM | POA: Diagnosis not present

## 2021-08-03 DIAGNOSIS — Z6836 Body mass index (BMI) 36.0-36.9, adult: Secondary | ICD-10-CM

## 2021-08-03 DIAGNOSIS — E669 Obesity, unspecified: Secondary | ICD-10-CM

## 2021-08-03 DIAGNOSIS — R7303 Prediabetes: Secondary | ICD-10-CM | POA: Diagnosis not present

## 2021-08-03 DIAGNOSIS — E65 Localized adiposity: Secondary | ICD-10-CM

## 2021-08-04 MED ORDER — PHENTERMINE HCL 37.5 MG PO TABS: ORAL_TABLET | ORAL | 0 refills | Status: DC

## 2021-08-05 NOTE — Progress Notes (Signed)
Chief Complaint:   OBESITY Brad Thompson is here to discuss his progress with his obesity treatment plan along with follow-up of his obesity related diagnoses. See Medical Weight Management Flowsheet for complete bioelectrical impedance results.  Today's visit was #: 24 Starting weight: 279 lbs Starting date: 09/03/2019 Weight change since last visit: +6 lbs Total lbs lost to date: 15 lbs Total weight loss percentage to date: -5.38%  Nutrition Plan: Category 4 Plan for 60% of the time.  Activity: Increased walking.  Interim History: Chan says he has not had Mounjaro in several weeks.  ED has resolved.  He says he has increased hunger cues.  Assessment/Plan:   1. Prediabetes, with polyphagia At goal. Goal is HgbA1c < 5.7.  Medication:  None (no Mounjaro in several weeks).    Plan: He will continue to focus on protein-rich, low simple carbohydrate foods. We reviewed the importance of hydration, regular exercise for stress reduction, and restorative sleep.   Lab Results  Component Value Date   HGBA1C 5.7 (H) 09/15/2020   Lab Results  Component Value Date   INSULIN 14.2 09/15/2020   INSULIN 28.3 (H) 12/25/2019   INSULIN 28.9 (H) 09/03/2019   2. Visceral obesity Current visceral fat rating: 18. Visceral fat rating goal is < 13. Visceral adipose tissue is a hormonally active component of total body fat. This body composition phenotype is associated with medical disorders such as metabolic syndrome, cardiovascular disease, and several malignancies including prostate, breast, and colorectal cancers. Starting goal: Lose 7-10% of starting weight.   3. Emotional eating tendencies Not at goal. Medication: None.   Plan: Start phentermine 37.5 mg - 1/2 tablet in the morning and 1/2 tablet at noon.Discussed cues and consequences, how thoughts affect eating, model of thoughts, feelings, and behaviors, and strategies for change by focusing on the cue. Discussed cognitive distortions, coping  thoughts, and how to change your thoughts.  - Start phentermine (ADIPEX-P) 37.5 MG tablet; 1/2 tab in am, 1/2 tab at noon  Dispense: 30 tablet; Refill: 0  We reviewed potential side effects including insomnia, dry mouth, increased heart rate and blood pressure, and increased anxiety. We reviewed reducing caffeine consumption while taking phentermine, especially if the patient is experiencing side effects. Alternative treatment options were discussed. All questions were answered, and the patient wishes to move forward with this medication.  I have consulted the [place] Controlled Substances Registry for this patient, and feel the risk/benefit ratio today is favorable for proceeding with this prescription for a controlled substance. The patient understands monitoring parameters and red flags.   4. Obesity, current BMI 36.9  Course: Leroi is currently in the action stage of change. As such, his goal is to continue with weight loss efforts.   Nutrition goals: He has agreed to the Category 4 Plan.   Exercise goals:  As is.  Behavioral modification strategies: increasing lean protein intake, decreasing simple carbohydrates, and increasing vegetables.  Sakib has agreed to follow-up with our clinic in 4 weeks. He was informed of the importance of frequent follow-up visits to maximize his success with intensive lifestyle modifications for his multiple health conditions.   Objective:   Blood pressure 135/78, pulse 68, temperature 98.5 F (36.9 C), temperature source Oral, height 5\' 11"  (1.803 m), weight 264 lb (119.7 kg), SpO2 100 %. Body mass index is 36.82 kg/m.  General: Cooperative, alert, well developed, in no acute distress. HEENT: Conjunctivae and lids unremarkable. Cardiovascular: Regular rhythm.  Lungs: Normal work of breathing. Neurologic: No  focal deficits.   Lab Results  Component Value Date   CREATININE 0.96 09/15/2020   BUN 14 09/15/2020   NA 138 09/15/2020   K 4.6 09/15/2020   CL  101 09/15/2020   CO2 21 09/15/2020   Lab Results  Component Value Date   ALT 35 09/15/2020   AST 24 09/15/2020   ALKPHOS 49 09/15/2020   BILITOT 0.6 09/15/2020   Lab Results  Component Value Date   HGBA1C 5.7 (H) 09/15/2020   HGBA1C 5.3 09/03/2019   Lab Results  Component Value Date   INSULIN 14.2 09/15/2020   INSULIN 28.3 (H) 12/25/2019   INSULIN 28.9 (H) 09/03/2019   Lab Results  Component Value Date   TSH 2.200 09/03/2019   Lab Results  Component Value Date   CHOL 191 09/15/2020   HDL 43 09/15/2020   LDLCALC 132 (H) 09/15/2020   TRIG 87 09/15/2020   CHOLHDL 4.4 09/15/2020   Lab Results  Component Value Date   VD25OH 25.3 (L) 09/15/2020   VD25OH 50.4 12/25/2019   VD25OH 23.2 (L) 09/03/2019   Lab Results  Component Value Date   WBC 15.2 (H) 06/07/2020   HGB 16.2 06/07/2020   HCT 43.9 06/07/2020   MCV 80.4 06/07/2020   PLT 163 06/07/2020   Attestation Statements:   Reviewed by clinician on day of visit: allergies, medications, problem list, medical history, surgical history, family history, social history, and previous encounter notes.  I, Insurance claims handler, CMA, am acting as transcriptionist for Helane Rima, DO  I have reviewed the above documentation for accuracy and completeness, and I agree with the above. -  Helane Rima, DO, MS, FAAFP, DABOM - Family and Bariatric Medicine.

## 2021-08-25 ENCOUNTER — Ambulatory Visit (INDEPENDENT_AMBULATORY_CARE_PROVIDER_SITE_OTHER): Payer: BC Managed Care – PPO | Admitting: Family Medicine

## 2021-08-25 ENCOUNTER — Other Ambulatory Visit: Payer: Self-pay

## 2021-08-25 ENCOUNTER — Encounter (INDEPENDENT_AMBULATORY_CARE_PROVIDER_SITE_OTHER): Payer: Self-pay | Admitting: Family Medicine

## 2021-08-25 VITALS — BP 133/78 | HR 86 | Temp 98.1°F | Ht 71.0 in | Wt 261.0 lb

## 2021-08-25 DIAGNOSIS — E7800 Pure hypercholesterolemia, unspecified: Secondary | ICD-10-CM | POA: Diagnosis not present

## 2021-08-25 DIAGNOSIS — R7303 Prediabetes: Secondary | ICD-10-CM | POA: Diagnosis not present

## 2021-08-25 DIAGNOSIS — E669 Obesity, unspecified: Secondary | ICD-10-CM | POA: Diagnosis not present

## 2021-08-25 DIAGNOSIS — F3289 Other specified depressive episodes: Secondary | ICD-10-CM | POA: Diagnosis not present

## 2021-08-25 DIAGNOSIS — Z6836 Body mass index (BMI) 36.0-36.9, adult: Secondary | ICD-10-CM

## 2021-08-25 MED ORDER — PHENTERMINE HCL 37.5 MG PO TABS: 37.5000 mg | ORAL_TABLET | Freq: Every day | ORAL | 0 refills | Status: AC

## 2021-08-31 NOTE — Progress Notes (Signed)
Chief Complaint:   OBESITY Brad Thompson is here to discuss his progress with his obesity treatment plan along with follow-up of his obesity related diagnoses. See Medical Weight Management Flowsheet for complete bioelectrical impedance results.  Today's visit was #: 25 Starting weight: 279 lbs Starting date: 09/03/2019 Weight change since last visit: 3 lbs Total lbs lost to date: 18 lbs Total weight loss percentage to date: -6.45%  Nutrition Plan: Category 4 Plan for 70-80% of the time.  Activity: Increased activity. Anti-obesity medications: phentermine 37.5 mg daily. Reported side effects: None.  Interim History: Brad Thompson says that phentermine 37.5 mg is working well.  He endorses less polyphagia.  Denies side effects.  Assessment/Plan:   1. Prediabetes, with polyphagia At goal. Goal is HgbA1c < 5.7.  Medication: None.    Plan: He will continue to focus on protein-rich, low simple carbohydrate foods. We reviewed the importance of hydration, regular exercise for stress reduction, and restorative sleep.   Lab Results  Component Value Date   HGBA1C 5.7 (H) 09/15/2020   Lab Results  Component Value Date   INSULIN 14.2 09/15/2020   INSULIN 28.3 (H) 12/25/2019   INSULIN 28.9 (H) 09/03/2019   2. Pure hypercholesterolemia Course: Improving, but not optimized. Lipid-lowering medications: None.   Plan: Dietary changes: Increase soluble fiber, decrease simple carbohydrates, decrease saturated fat. Exercise changes: Moderate to vigorous-intensity aerobic activity 150 minutes per week or as tolerated. We will continue to monitor along with PCP as it pertains to his weight loss journey.  Lab Results  Component Value Date   CHOL 191 09/15/2020   HDL 43 09/15/2020   LDLCALC 132 (H) 09/15/2020   TRIG 87 09/15/2020   CHOLHDL 4.4 09/15/2020   Lab Results  Component Value Date   ALT 35 09/15/2020   AST 24 09/15/2020   ALKPHOS 49 09/15/2020   BILITOT 0.6 09/15/2020   The 10-year ASCVD  risk score (Arnett DK, et al., 2019) is: 9%   Values used to calculate the score:     Age: 51 years     Sex: Male     Is Non-Hispanic African American: Yes     Diabetic: No     Tobacco smoker: Yes     Systolic Blood Pressure: 133 mmHg     Is BP treated: No     HDL Cholesterol: 43 mg/dL     Total Cholesterol: 191 mg/dL  3. Emotional eating tendencies Controlled. Medication: phentermine 37.5 mg daily.   Plan: Continue phentermine 37.5 mg daily.  Will refill today.  Discussed cues and consequences, how thoughts affect eating, model of thoughts, feelings, and behaviors, and strategies for change by focusing on the cue. Discussed cognitive distortions, coping thoughts, and how to change your thoughts.  - Refill phentermine (ADIPEX-P) 37.5 MG tablet; Take 1 tablet (37.5 mg total) by mouth daily before breakfast.  Dispense: 90 tablet; Refill: 0  Having again reminded the patient of the "off label" use of Phentermine beyond three consecutive months, and again discussing the risks, benefits, contraindications, and limitations of it's use; given it's role in the successful treatment of obesity thus far and lack of adverse effect, patient has expressed desire and given informed verbal consent to continue use.   I have consulted the [place] Controlled Substances Registry for this patient, and feel the risk/benefit ratio today is favorable for proceeding with this prescription for a controlled substance. The patient understands monitoring parameters and red flags.   4. Obesity with current BMI of 36.4  Course: Brad Thompson is currently in the action stage of change. As such, his goal is to continue with weight loss efforts.   Nutrition goals: He has agreed to the Category 4 Plan.   Exercise goals:  As is.  Behavioral modification strategies: increasing lean protein intake, decreasing simple carbohydrates, increasing vegetables, and increasing water intake.  Brad Thompson has agreed to follow-up with our clinic in 4  weeks. He was informed of the importance of frequent follow-up visits to maximize his success with intensive lifestyle modifications for his multiple health conditions.   Objective:   Blood pressure 133/78, pulse 86, temperature 98.1 F (36.7 C), temperature source Oral, height 5\' 11"  (1.803 m), weight 261 lb (118.4 kg), SpO2 97 %. Body mass index is 36.4 kg/m.  General: Cooperative, alert, well developed, in no acute distress. HEENT: Conjunctivae and lids unremarkable. Cardiovascular: Regular rhythm.  Lungs: Normal work of breathing. Neurologic: No focal deficits.   Lab Results  Component Value Date   CREATININE 0.96 09/15/2020   BUN 14 09/15/2020   NA 138 09/15/2020   K 4.6 09/15/2020   CL 101 09/15/2020   CO2 21 09/15/2020   Lab Results  Component Value Date   ALT 35 09/15/2020   AST 24 09/15/2020   ALKPHOS 49 09/15/2020   BILITOT 0.6 09/15/2020   Lab Results  Component Value Date   HGBA1C 5.7 (H) 09/15/2020   HGBA1C 5.3 09/03/2019   Lab Results  Component Value Date   INSULIN 14.2 09/15/2020   INSULIN 28.3 (H) 12/25/2019   INSULIN 28.9 (H) 09/03/2019   Lab Results  Component Value Date   TSH 2.200 09/03/2019   Lab Results  Component Value Date   CHOL 191 09/15/2020   HDL 43 09/15/2020   LDLCALC 132 (H) 09/15/2020   TRIG 87 09/15/2020   CHOLHDL 4.4 09/15/2020   Lab Results  Component Value Date   VD25OH 25.3 (L) 09/15/2020   VD25OH 50.4 12/25/2019   VD25OH 23.2 (L) 09/03/2019   Lab Results  Component Value Date   WBC 15.2 (H) 06/07/2020   HGB 16.2 06/07/2020   HCT 43.9 06/07/2020   MCV 80.4 06/07/2020   PLT 163 06/07/2020   Attestation Statements:   Reviewed by clinician on day of visit: allergies, medications, problem list, medical history, surgical history, family history, social history, and previous encounter notes.  I, 06/09/2020, CMA, am acting as transcriptionist for Insurance claims handler, DO  I have reviewed the above documentation for  accuracy and completeness, and I agree with the above. -  Helane Rima, DO, MS, FAAFP, DABOM - Family and Bariatric Medicine.

## 2021-09-08 ENCOUNTER — Encounter (INDEPENDENT_AMBULATORY_CARE_PROVIDER_SITE_OTHER): Payer: Self-pay

## 2021-09-08 ENCOUNTER — Telehealth (INDEPENDENT_AMBULATORY_CARE_PROVIDER_SITE_OTHER): Payer: Self-pay | Admitting: Family Medicine

## 2021-09-08 NOTE — Telephone Encounter (Signed)
Prior authorization approved for Phentermine. Effective: 09/07/2021 - 12/08/2021. Patient sent approval message via mychart.  ?

## 2021-09-22 ENCOUNTER — Telehealth (INDEPENDENT_AMBULATORY_CARE_PROVIDER_SITE_OTHER): Payer: BC Managed Care – PPO | Admitting: Family Medicine

## 2022-01-19 ENCOUNTER — Encounter (INDEPENDENT_AMBULATORY_CARE_PROVIDER_SITE_OTHER): Payer: Self-pay

## 2022-01-29 IMAGING — CT CT RENAL STONE PROTOCOL
2 of 4 series · 16 of 46 positions shown, 18 images · non-contrast
Comparison: 04/25/2014

CLINICAL DATA: Right flank pain with vomiting.

EXAM:
CT ABDOMEN AND PELVIS WITHOUT CONTRAST
TECHNIQUE: Multidetector CT imaging of the abdomen and pelvis was performed
following the standard protocol without IV contrast.

[Series 3: ap without · axial · non-contrast · 0.93mm/px · z∈[+692,+1142]mm · 13 of 104 slices shown, 15 images]
[im 7/104  soft-tissue]
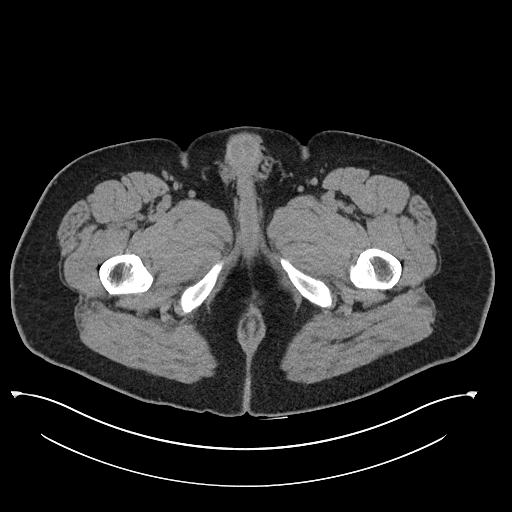
[im 7/104  bone]
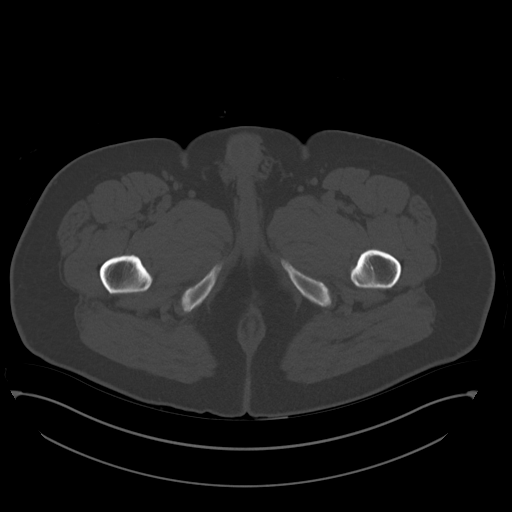
[im 13/104  soft-tissue]
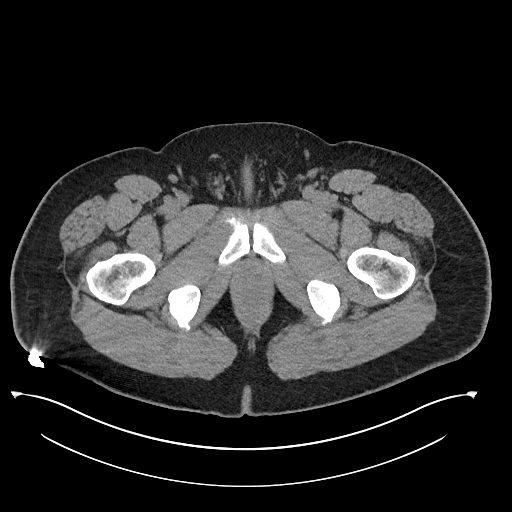
[im 25/104  soft-tissue]
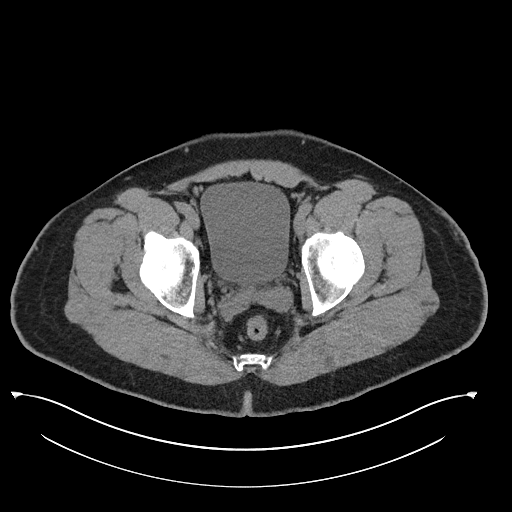
[im 31/104  soft-tissue]
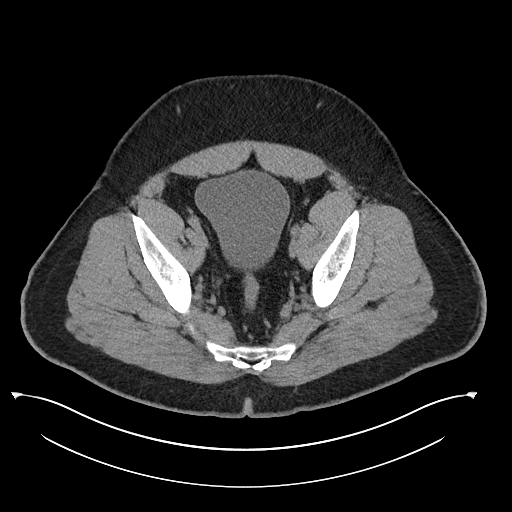
[im 37/104  soft-tissue]
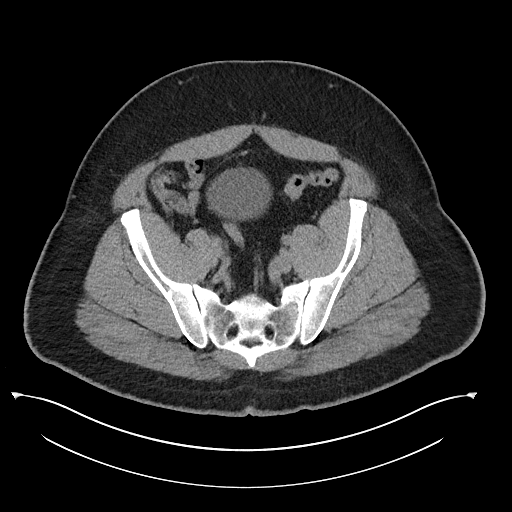
[im 43/104  soft-tissue]
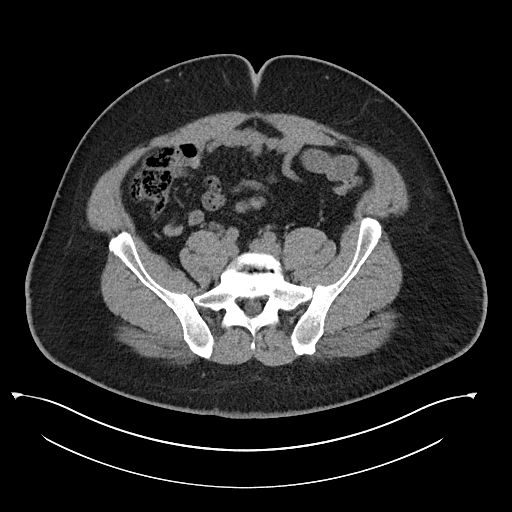
[im 55/104  soft-tissue]
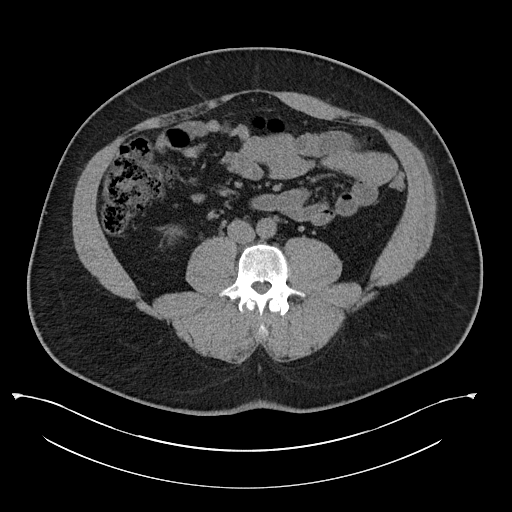
[im 61/104  soft-tissue]
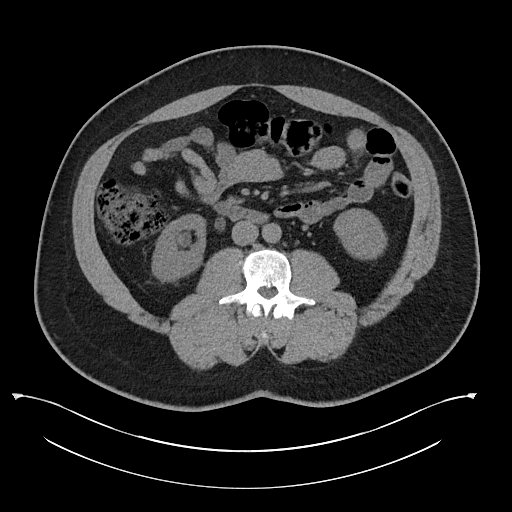
[im 67/104  soft-tissue]
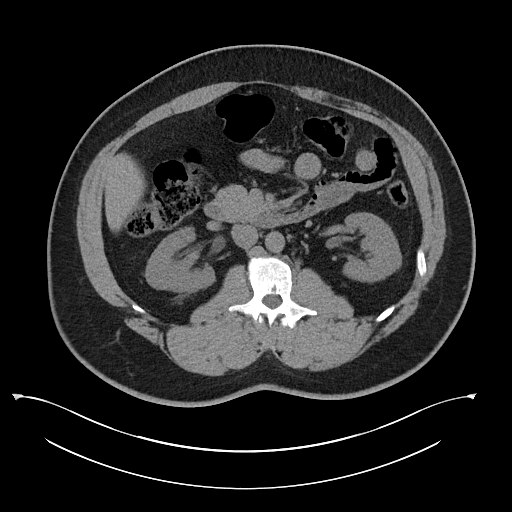
[im 67/104  bone]
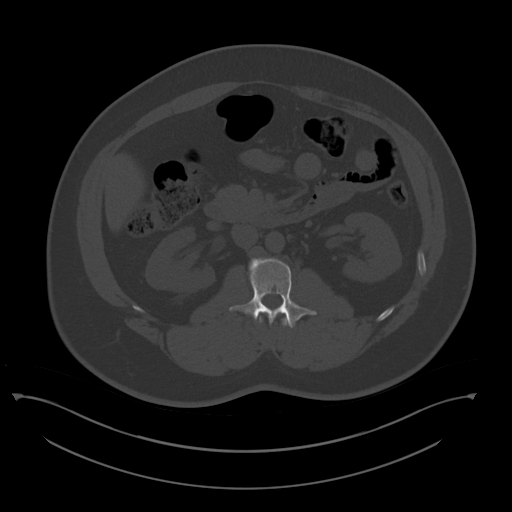
[im 73/104  soft-tissue]
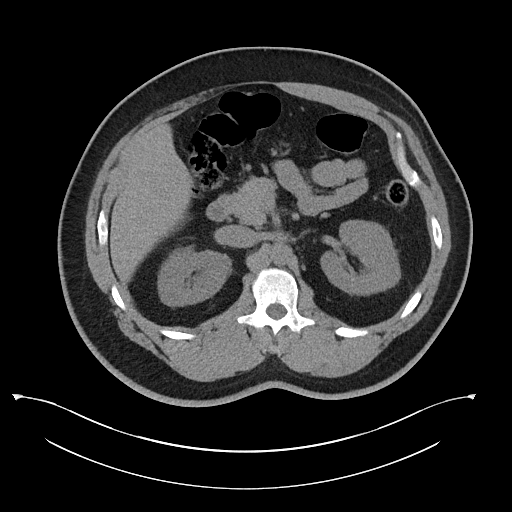
[im 79/104  soft-tissue]
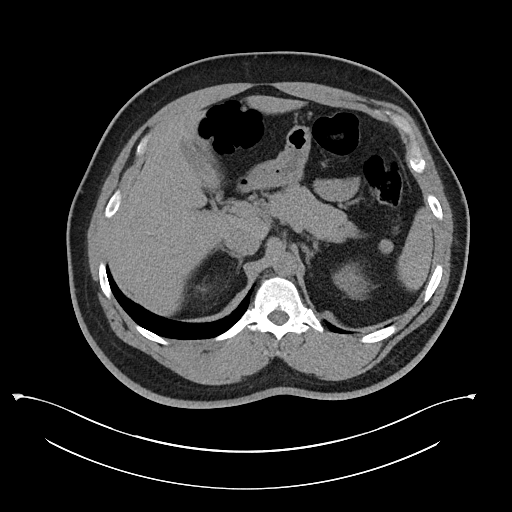
[im 91/104  soft-tissue]
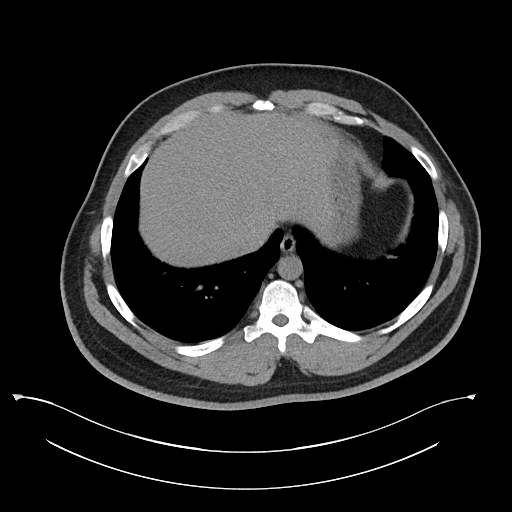
[im 97/104  soft-tissue]
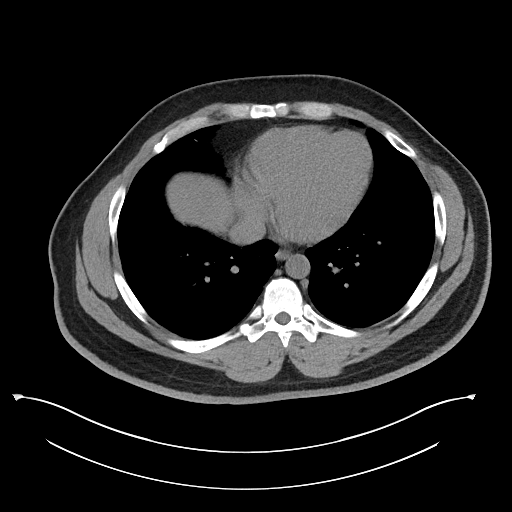

[Series 6: cor · coronal · 0.90mm/px · 3 of 117 slices shown]
[im 39/117  soft-tissue]
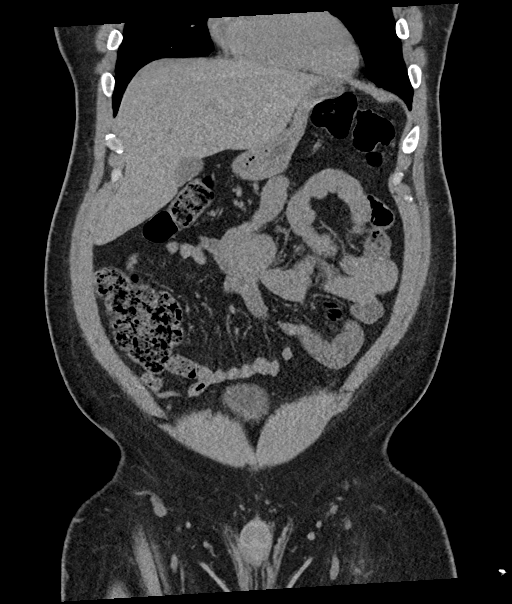
[im 52/117  soft-tissue]
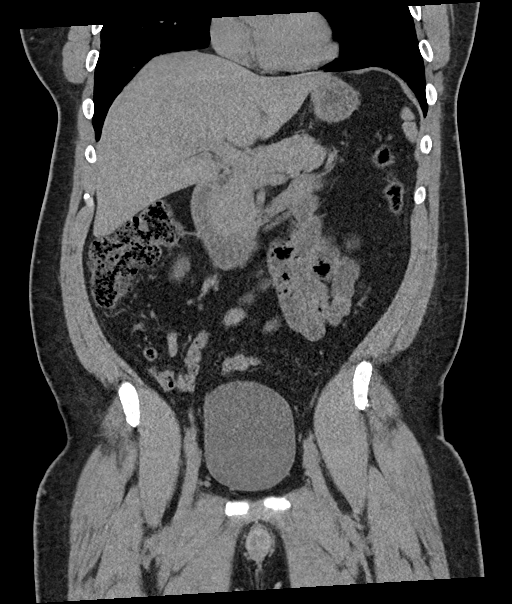
[im 65/117  soft-tissue]
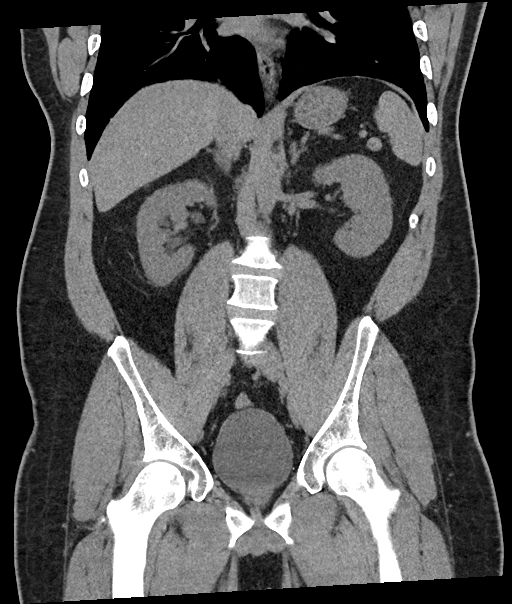

[16 of 46 positions shown; findings below may reference images not displayed]

FINDINGS: Lower chest: Unremarkable.

Hepatobiliary: 7 mm hypoattenuating lesion in the anterior hepatic
dome is too small to characterize but likely benign. There is no
evidence for gallstones, gallbladder wall thickening, or
pericholecystic fluid. No intrahepatic or extrahepatic biliary
dilation.

Pancreas: No focal mass lesion. No dilatation of the main duct. No
intraparenchymal cyst. No peripancreatic edema.

Spleen: No splenomegaly. No focal mass lesion.

Adrenals/Urinary Tract: No adrenal nodule or mass. Left kidney and
ureter unremarkable.

No right renal stones. There is mild right hydroureteronephrosis
with 4 mm stone identified at the right UVJ. No evidence for
calcified stone free within the bladder lumen.

Stomach/Bowel: Stomach is unremarkable. No gastric wall thickening.
No evidence of outlet obstruction. Duodenum is normally positioned
as is the ligament of Treitz. No small bowel wall thickening. No
small bowel dilatation. The terminal ileum is normal. The appendix
is normal. No gross colonic mass. No colonic wall thickening.

Vascular/Lymphatic: No abdominal aortic aneurysm. No abdominal
aortic atherosclerotic calcification. There is no gastrohepatic or
hepatoduodenal ligament lymphadenopathy. No retroperitoneal or
mesenteric lymphadenopathy. No pelvic sidewall lymphadenopathy.

Reproductive: The prostate gland and seminal vesicles are
unremarkable.

Other: No intraperitoneal free fluid.

Musculoskeletal: No worrisome lytic or sclerotic osseous
abnormality.
IMPRESSION: 1. 4 mm right UVJ stone causes mild right hydroureteronephrosis. No
other urinary stone disease evident.
2. 7 mm hypoattenuating lesion in the anterior hepatic dome is too
small to characterize but likely benign.

## 2023-06-06 ENCOUNTER — Ambulatory Visit
Admission: EM | Admit: 2023-06-06 | Discharge: 2023-06-06 | Disposition: A | Discharge status: Home / Self Care | Payer: BC Managed Care – PPO | Attending: Family Medicine | Admitting: Family Medicine

## 2023-06-06 ENCOUNTER — Ambulatory Visit (INDEPENDENT_AMBULATORY_CARE_PROVIDER_SITE_OTHER): Payer: BC Managed Care – PPO

## 2023-06-06 DIAGNOSIS — R051 Acute cough: Secondary | ICD-10-CM

## 2023-06-06 NOTE — ED Provider Notes (Signed)
[facility]   [id number] 06/06/23 Arrival Time: 1024  ASSESSMENT & PLAN:  1. Acute cough    I have personally viewed and independently interpreted the imaging studies ordered this visit. CXR: no acute changes; no PNA.  Likely post-viral cough; has cough med at home he can use. OTC symptom care as needed.   Follow-up Information     Renford Dills, MD.   Specialty: Internal Medicine Why: As needed. Contact information: [address] [place] [address] [phone]                 Reviewed expectations re: course of current medical issues. Questions answered. Outlined signs and symptoms indicating need for more acute intervention. Understanding verbalized. After Visit Summary given.   SUBJECTIVE: History from: Patient. Brad Thompson is a 52 y.o. male. Patient presents reports cold/ cough lingering in chest, congestion and lethargic. Symptoms started last week Thursday. Treated with alka seltzer, mucus relief and Dayquil with minimal relief.  Overall though he is feeling much better. Denies SOB/CP.  OBJECTIVE:  Vitals:   06/06/23 1200 06/06/23 1201  BP:  (!) 139/92  Pulse:  87  Resp:  18  Temp:  98.4 F (36.9 C)  TempSrc:  Oral  SpO2:  98%  Weight: 113.4 kg   Height: 5\' 11"  (1.803 m)     General appearance: alert; no distress Eyes: PERRLA; EOMI; conjunctiva normal HENT: [place]; AT; without nasal congestion Neck: supple  Lungs: speaks full sentences without difficulty; unlabored; CTAB Extremities: no edema Skin: warm and dry Neurologic: normal gait Psychological: alert and cooperative; normal mood and affect    Imaging: DG Chest 2 View Result Date: 06/06/2023 CLINICAL DATA:  Cough for greater than 1 week, congestion, lethargy EXAM: CHEST - 2 VIEW COMPARISON:  None Available. FINDINGS: Frontal and lateral views of the chest are obtained on 3 images. The cardiac silhouette is unremarkable. No airspace disease, effusion,  or pneumothorax. No acute bony abnormalities. IMPRESSION: 1. No acute intrathoracic process. Electronically Signed   By: Sharlet Salina M.D.   On: 06/06/2023 12:38    No Known Allergies  Past Medical History:  Diagnosis Date   Constipation    High cholesterol    Joint pain    Multiple food allergies    Sleep apnea    Social History   Socioeconomic History   Marital status: Married    Spouse name: Not on file   Number of children: Not on file   Years of education: Not on file   Highest education level: Not on file  Occupational History   Occupation: tech support  Tobacco Use   Smoking status: Never   Smokeless tobacco: Never  Vaping Use   Vaping status: Never Used  Substance and Sexual Activity   Alcohol use: Yes    Comment: occ.   Drug use: No   Sexual activity: Not on file  Other Topics Concern   Not on file  Social History Narrative   Not on file   Social Drivers of Health   Financial Resource Strain: Not on file  Food Insecurity: Not on file  Transportation Needs: Not on file  Physical Activity: Not on file  Stress: Not on file  Social Connections: Not on file  Intimate Partner Violence: Not on file   Family History  Problem Relation Age of Onset   Obesity Mother    Cancer Mother    Sudden death Father    Heart disease Father  Alcoholism Father    Past Surgical History:  Procedure Laterality Date   wisdom teeth removal       Mardella Layman, MD 06/06/23 1258

## 2023-06-06 NOTE — ED Triage Notes (Signed)
Patient presents reports cold/ cough lingering in chest, congestion and lethargic. Symptoms started last week Thursday. Treated with alka seltzer, mucus relief and Dayquil with minimal relief.
